# Patient Record
Sex: Female | Born: 1955 | Race: White | Hispanic: No | Marital: Married | Smoking: Former smoker
Health system: Southern US, Community
[De-identification: ages and names within clinical notes are randomized; demographics above are authoritative.]

## PROBLEM LIST (undated history)

## (undated) HISTORY — PX: PRE-MALIGNANT / BENIGN SKIN LESION EXCISION: SHX160

---

## 1997-11-23 ENCOUNTER — Other Ambulatory Visit: Admission: RE | Admit: 1997-11-23 | Discharge: 1997-11-23 | Payer: Self-pay | Admitting: Family Medicine

## 1998-03-04 HISTORY — PX: ABDOMINAL HYSTERECTOMY: SHX81

## 1998-04-20 ENCOUNTER — Ambulatory Visit (HOSPITAL_COMMUNITY): Admission: RE | Admit: 1998-04-20 | Discharge: 1998-04-20 | Payer: Self-pay | Admitting: Gastroenterology

## 1999-11-21 ENCOUNTER — Ambulatory Visit (HOSPITAL_COMMUNITY): Admission: RE | Admit: 1999-11-21 | Discharge: 1999-11-21 | Payer: Self-pay | Admitting: *Deleted

## 2000-08-11 ENCOUNTER — Inpatient Hospital Stay (HOSPITAL_COMMUNITY): Admission: RE | Admit: 2000-08-11 | Discharge: 2000-08-13 | Payer: Self-pay | Admitting: *Deleted

## 2000-08-11 ENCOUNTER — Encounter (INDEPENDENT_AMBULATORY_CARE_PROVIDER_SITE_OTHER): Payer: Self-pay | Admitting: Specialist

## 2001-05-14 ENCOUNTER — Ambulatory Visit (HOSPITAL_COMMUNITY): Admission: RE | Admit: 2001-05-14 | Discharge: 2001-05-14 | Payer: Self-pay | Admitting: Gastroenterology

## 2002-12-22 ENCOUNTER — Emergency Department (HOSPITAL_COMMUNITY): Admission: EM | Admit: 2002-12-22 | Discharge: 2002-12-23 | Payer: Self-pay | Admitting: Emergency Medicine

## 2004-06-26 ENCOUNTER — Emergency Department (HOSPITAL_COMMUNITY): Admission: EM | Admit: 2004-06-26 | Discharge: 2004-06-26 | Payer: Self-pay | Admitting: Family Medicine

## 2004-10-19 ENCOUNTER — Encounter: Admission: RE | Admit: 2004-10-19 | Discharge: 2004-10-19 | Payer: Self-pay | Admitting: Gastroenterology

## 2005-11-15 ENCOUNTER — Emergency Department (HOSPITAL_COMMUNITY): Admission: EM | Admit: 2005-11-15 | Discharge: 2005-11-15 | Payer: Self-pay | Admitting: Family Medicine

## 2006-05-19 ENCOUNTER — Encounter (INDEPENDENT_AMBULATORY_CARE_PROVIDER_SITE_OTHER): Payer: Self-pay | Admitting: Specialist

## 2006-05-19 ENCOUNTER — Ambulatory Visit (HOSPITAL_COMMUNITY): Admission: RE | Admit: 2006-05-19 | Discharge: 2006-05-19 | Payer: Self-pay | Admitting: Gastroenterology

## 2009-10-31 ENCOUNTER — Emergency Department (HOSPITAL_COMMUNITY): Admission: EM | Admit: 2009-10-31 | Discharge: 2009-10-31 | Payer: Self-pay | Admitting: Family Medicine

## 2010-05-18 LAB — POCT I-STAT, CHEM 8
BUN: 5 mg/dL — ABNORMAL LOW (ref 6–23)
Calcium, Ion: 1.16 mmol/L (ref 1.12–1.32)
Chloride: 105 mEq/L (ref 96–112)
Creatinine, Ser: 1 mg/dL (ref 0.4–1.2)
Glucose, Bld: 81 mg/dL (ref 70–99)
HCT: 41 % (ref 36.0–46.0)
Hemoglobin: 13.9 g/dL (ref 12.0–15.0)
Potassium: 3.5 mEq/L (ref 3.5–5.1)
Sodium: 141 mEq/L (ref 135–145)
TCO2: 26 mmol/L (ref 0–100)

## 2010-07-20 NOTE — Op Note (Signed)
[facility]  Patient:    Shelley Berry, Shelley Berry                           MRN: [id number] Proc. Date: 08/11/00 Attending:  Pershing Cox, M.D. CC:         Shelley Berry, M.D.   Operative Report  PREOPERATIVE DIAGNOSES:  Myomatous uterus, urinary frequency.  POSTOPERATIVE DIAGNOSES:  Myomatous uterus with inadvertent cystotomy.  PROCEDURE:  Exploratory laparotomy, total abdominal hysterectomy.  ANESTHESIA:  General endotracheal.  SURGEON:  Pershing Cox, M.D.  ASSISTANT:  Dr. Earlene Plater.  INDICATIONS FOR PROCEDURE:  Shelley Berry is a 55 year old female, who presented to her physician with urinary frequency.  She had one heavy [place] of bleeding on her menstrual periods.  She has a history of colitis and is on Asacol.  The patient had a sonogram in February 2001.  This showed multiple uterine myomas. The endometrial stripe was posteriorly displaced by a large anterior and fundal myoma but was otherwise normal.  After initial consultation in our office in March, she was seen by Dr. Deanne Coffer in consultation, as she wanted to consider uterine artery embolization.  She had a pelvic MRI which again showed multiple uterine myomas and the one myoma causing displacement of the uterine stripe.  She rethought her decision and decided to proceed with hysterectomy. Because of the size of her uterus, we did not consider LAVH.  OPERATIVE FINDINGS:  The uterus was approximately 14 weeks in size.  There were multiple fibroids, one lying in a pedunculated fashion just below the right ovary.  The right ovary itself contained a 2 cm cyst but was otherwise normal.  There were other uterine myomas distorting the fundus of the uterus and in particular, the anterior portion of the uterus but no cervical distortion or distortion of the uterine arteries.  Cul-de-sac was deep. Otherwise her examination was normal.  DESCRIPTION OF PROCEDURE:  Shelley Berry was brought to the operating  room with an IV in place.  She had received 1 gram of Ancef in the holding area.  She was placed supine on the OR table, and general endotracheal anesthesia was administered without difficulty.  She was placed in the frogleg position, and the anterior abdominal wall, upper thighs, perineum, and vagina were prepped with a solution of Hibiclens.  Foley catheter was sterilely inserted.  Exam under anesthesia before prepping had shown the uterus lifted.  Although there was a nodule in the back of the uterus, I felt that a transverse incision would be appropriate.  After the patient was prepped, she was draped for a transverse incision. Pfannenstiel incision was made, cutting through the skin to a length of approximately 12 cm.  This was carried down through the subcutaneous tissues until the fascia was encountered.  The fascia was opened and then incised with Mayo scissors, using a smile-like technique.  The fascia was separated from the rectus muscles by blunt dissection using cautery where appropriate. Rectus muscles were divided in the midline.  Peritoneum was tented and opened atraumatically.  The peritoneal incision was extended superiorly, and the upper dissection of the fascia from the muscles was also extended. Exploratory laparotomy was performed with no abnormal findings.  The self-retaining retractor was placed with towels protecting the skin. Bladder blade was used to retract the bladder.  The round ligaments were identified and suture ligated.  The peritoneum was incised by cautery, opening the broad ligament.  The posterior wall of  the broad ligament was incised, and the uteroovarian ligament was clamped with Masterson clamps.  This pedicle was cut, suture and free-tie ligated.  Next, the bladder was taken down off of the lower uterine segment and dissected from the lower uterine segment.  In dissecting the bladder, a small injury was made in the bladder approximately 0.5 cm  in size.  This defect was grasped with hemostats and held as we continued the dissection of the bladder off of the upper portion of the cervix.  Next, the cystotomy was closed using 3-0 Vicryl as a running stitch, then an inverting stitch.  The bladder was then placed away from the incision as we proceeded.  Uterine arteries were skeletonized, clamped, cut, and suture ligated.  A double-tie technique was made so that the second clamp and tie passed around the first, giving essentially a double uterine artery ligature.  Straight clamps were placed along the lower uterine segment, and a cardinal bite was taken on each side.  This was Heaney-fixated during ligation.  The uterosacral ligaments were grasped and rotated such that the clamp included the lower portion of the cardinal ligament.  These were cut and suture ligated and held for later in the procedure.  We entered the patients vagina on the right. Satinsky scissors were used to excise the cervix from the vagina.  The angles were then closed with a 0 Vicryl suture using an in and out fixated to the cuff technique.  Anterior and posterior vagina were whipped with a locking 0 Vicryl.  The anterior and posterior vagina were then brought together with a single suture.  Once we had completed this, the uterosacral ligaments were tied to the angled sutures.  We inspected beneath the bladder, and there was no evidence of bleeding.  Ovarian pedicle was sutured to the round ligament.  On the left, it was necessary to close the peritoneum between the two.  On the right, they were closely approximated, so a separate closure was not necessary.  The pelvis was irrigated with warm saline.  There was no evidence of bleeding. Rectosigmoid was layered down into the cul-de-sac.  Small bowel was allowed to fall out over the top.  The fascia was then closed using running 0 Monocryl from each side to the middle.  Marcaine 0.25% was injected into the  lateral margins of the fascia.  Underlying muscles were carefully inspected, and there  was no bleeding prior to closure.  Monocryl 0 was run from side-to-side and tied in the middle.  Reinforcing 0 Vicryl stitch was used when we were not able to get the bleeding from the edge to stop.  Marcaine 0.25% was injected into the skin, and skin staples were applied after inspecting the subcutaneous tissues and finding no evidence of bleeding.  SPECIMEN:  Myomatous uterus.  ESTIMATED BLOOD LOSS:  150 cc.  URINE OUTPUT:  175 cc. DD:  08/11/00 TD:  08/11/00 Job: [id number]

## 2010-07-20 NOTE — Procedures (Signed)
[place]  Patient:    Shelley Berry, Shelley A. Visit Number: [id number] MRN: [id number]          Service Type: Attending:  Anselmo Rod, M.D. Dictated by:   Anselmo Rod, M.D. Proc. Date: 05/14/01   CC:         Heather Roberts, M.D.   Procedure Report  DATE OF BIRTH:  [birth year].  REFERRING PHYSICIAN:  Heather Roberts, M.D.  PROCEDURE PERFORMED:  Colonoscopy.  ENDOSCOPIST:  Anselmo Rod, M.D.  INSTRUMENT USED:  Olympus pediatric colonoscope.  INDICATIONS FOR PROCEDURE:  Family history of colon cancer and a personal history of colitis in a 55 year old white female who has had recent flare. Rule out colonic polyps, masses, etc. and evaluate extent of disease.  PREPROCEDURE PREPARATION:  Informed consent was procured from the patient. The patient was fasted for eight hours prior to the procedure and prepped with a bottle of magnesium citrate and a gallon of NuLytely the night prior to the procedure.  PREPROCEDURE PHYSICAL:  The patient had stable vital signs.  Neck supple. Chest clear to auscultation.  S1, S2 regular.  Abdomen soft with normal abdominal bowel sounds.  DESCRIPTION OF PROCEDURE:  The patient was placed in the left lateral decubitus position and sedated with 60 mg of Demerol and 10 mg of Versed intravenously.  Once the patient was adequately sedated and maintained on low-flow oxygen and continuous cardiac monitoring, the Olympus video colonoscope was advanced from the rectum to the cecum and terminal ileum without difficulty.  The entire colonic mucosa up to the terminal ileum appeared healthy and without lesions.  The patient tolerated the procedure well without complications.  IMPRESSION:  Healthy-appearing colon.  RECOMMENDATIONS: 1. Repeat colorectal cancer screening is recommended in the next five years    unless the patient were to develop any abnormal symptoms in the interim. 2. Outpatient follow-up is  advised in the next two weeks. Dictated by:   Anselmo Rod, M.D. Attending:  Anselmo Rod, M.D. DD:  05/14/01 TD:  05/14/01 Job: [id number]

## 2010-07-20 NOTE — Discharge Summary (Signed)
[facility]  Patient:    Shelley Berry, Shelley Berry [facility]                       MRN: [id number] Adm. Date:  [id number] Disc. Date: 08/13/00 Attending:  Marin Comment CC:         Talmadge Coventry, M.D.                           Discharge Summary  ADMITTING DIAGNOSIS:  Symptomatic uterine myomas.  DISCHARGE DIAGNOSES: 1. Symptomatic uterine myomas. 2. Cystotomy.  PROCEDURE:  Total abdominal hysterectomy.  HOSPITAL COURSE:  For details of the patients admission History and Physical, please see the dictated History and Physical examination which is transcribed and in the chart dated August 11, 2000.  On the morning of admission, the patient was taken to the operating room.  Under general anesthesia, she underwent total abdominal hysterectomy.  She had 150 cc blood loss.  A small cystotomy was created in the dissection of the uterus from the bladder.  This was approximately 1/2 cm in size.  This was closed with an embrocating Vicryl stitch.  Foley catheter was placed and will be left in at the time of discharge.  The patients postoperative course was one of steady and progressive improvement.  On the evening of surgery, she used PCA morphine.  She had a lot of nightmares with this medication, but otherwise no other problems.  She received one dose of Toradol on the morning of postoperative Griffey #1, and then was able to start p.o. pain medications.  She required Tylox for pain relief. She began passing gas on the morning of postoperative Stolz #2.  She has tolerated a regular diet.  Her staples were removed and Steri-Strips with benzoin were applied.  She is to be discharged with her Foley catheter in place.  DISCHARGE MEDICATIONS: 1. Tylox one p.o. q.4h. 2. Darvocet one p.o. q.4h. 3. Macrobid 100 mg to take over the next five days until her Foley is discontinued. 4. Pyridium p.r.n. bladder spasm.  LABORATORY DATA AND X-RAY FINDINGS:  Final pathology returned  consistent with submucosal intramural and subserosal uterine myomas.  The patients hemoglobin on postoperative Laitinen #2 was 34.5. DD:  08/13/00 TD:  08/13/00 Job: [id number]

## 2010-07-20 NOTE — Op Note (Signed)
NAMEJONQUIL, Shelley Berry                  ACCOUNT NO.:  [id number]   MEDICAL RECORD NO.:  [id number]          PATIENT TYPE:  AMB   LOCATION:  ENDO                         FACILITY:  [facility]   PHYSICIAN:  Anselmo Rod, M.D.  DATE OF BIRTH:  Jun 22, 1955   DATE OF PROCEDURE:  05/19/2006  DATE OF DISCHARGE:                               OPERATIVE REPORT   PROCEDURE PERFORMED:  Colonoscopy with multiple cold biopsies.   ENDOSCOPIST:  Anselmo Rod, M.D.   INSTRUMENT USED:  Pentax video colonoscope.   INDICATIONS FOR PROCEDURE:  55 year old white female with a 10-year  history of ulcerative colitis undergoing surveillance colonoscopy to  rule out colonic polyps, masses, etc.   PREPROCEDURE PREPARATION:  Informed consent was procured from the  patient.  The patient fasted for 4 hours prior to the procedure and  prepped with 20 Osmoprep pills the night of and 12 Osmoprep pills the  morning of the procedure.  The risks and benefits of the procedure  including a 10% miss rate of cancer and polyp were discussed with the  patient as well.   PREPROCEDURE PHYSICAL:  The patient had stable vital signs.  Neck  supple.  Chest clear to auscultation.  S1, S2 regular.  Abdomen soft  with normal bowel sounds.   DESCRIPTION OF PROCEDURE:  The patient was placed in left lateral  decubitus position and sedated with 100 mcg of fentanyl and 8 mg of  Versed given intravenously in slow incremental doses. Once the patient  was adequately sedated and maintained on low-flow oxygen and continuous  cardiac monitoring, the Pentax video colonoscope was advanced from the  rectum to the cecum. The appendiceal orifice and ileocecal valve were  clearly visualized and photographed.  The terminal ileum appeared  healthy and without lesions.  There was patchy area with loss of  vascular markings.  Multiple biopsies were done from the right colon,  left colon and transverse colon to rule out dysplasia. No masses or  polyps  seen. There was no evidence of colitis. The patient tolerated the  procedure well without complications.  Retroflexion in rectum revealed  no abnormalities.   IMPRESSION:  1. Essentially normal colonoscopy from the rectum to the terminal      ileum.  No masses, polyps, erosions, ulcerations or diverticula      seen.  2. Patchy loss of vascular markings, biopsies done to rule out      dysplasia.   RECOMMENDATIONS:  1. Await pathology results.  2. Continue Asacol as before.  3. The patient is moving to the [place].  She is to      follow-up with a new gastroenterologist there.  All records will be      forwarded as requested by the patient.      Anselmo Rod, M.D.  Electronically Signed     JNM/MEDQ  D:  05/20/2006  T:  05/21/2006  Job:  [id number]

## 2010-08-03 HISTORY — PX: COLONOSCOPY: SHX174

## 2010-11-16 ENCOUNTER — Inpatient Hospital Stay (INDEPENDENT_AMBULATORY_CARE_PROVIDER_SITE_OTHER)
Admission: RE | Admit: 2010-11-16 | Discharge: 2010-11-16 | Disposition: A | Discharge status: Home / Self Care | Payer: 59 | Source: Ambulatory Visit | Attending: Family Medicine | Admitting: Family Medicine

## 2010-11-16 DIAGNOSIS — J40 Bronchitis, not specified as acute or chronic: Secondary | ICD-10-CM

## 2010-11-16 LAB — POCT RAPID STREP A: Streptococcus, Group A Screen (Direct): NEGATIVE

## 2010-12-24 ENCOUNTER — Ambulatory Visit (INDEPENDENT_AMBULATORY_CARE_PROVIDER_SITE_OTHER): Payer: Managed Care, Other (non HMO) | Admitting: Family Medicine

## 2010-12-24 ENCOUNTER — Encounter: Payer: Self-pay | Admitting: Family Medicine

## 2010-12-24 VITALS — BP 122/86 | HR 72 | Temp 98.1°F | Ht 64.0 in | Wt 195.0 lb

## 2010-12-24 DIAGNOSIS — R070 Pain in throat: Secondary | ICD-10-CM

## 2010-12-24 DIAGNOSIS — A6000 Herpesviral infection of urogenital system, unspecified: Secondary | ICD-10-CM

## 2010-12-24 DIAGNOSIS — K5190 Ulcerative colitis, unspecified, without complications: Secondary | ICD-10-CM | POA: Insufficient documentation

## 2010-12-24 DIAGNOSIS — Z23 Encounter for immunization: Secondary | ICD-10-CM

## 2010-12-24 MED ORDER — VALACYCLOVIR HCL 500 MG PO TABS: 500.0000 mg | ORAL_TABLET | Freq: Two times a day (BID) | ORAL | Status: DC

## 2010-12-24 NOTE — Progress Notes (Signed)
  Subjective:    Patient ID: Shelley Berry, female    DOB: 04/14/1955, 55 y.o.   MRN: [id number]  HPI 55 yr old female to establish with [place] and to discuss a recent throat problem. About 2 weeks ago she got choked on some food and had a sudden sharp pain in the back of the throat. After a few minutes the food went down but she had some sorness in the throat for about one week. Now she feels fine, no trouble swallowing or speaking. She had moved from [place] to [place] about 8 years ago, and then she moved back here earlier this year. She feels fine in general. Her ulcerative colitis is in complete remission, and she has no problems. She had a normal colonoscopy this summer, and she will be due for another one in 2 years.    Review of Systems  Constitutional: Negative.   Respiratory: Negative.   Cardiovascular: Negative.   Gastrointestinal: Negative.        Objective:   Physical Exam  Constitutional: She appears well-nourished.  HENT:  Right Ear: External ear normal.  Left Ear: External ear normal.  Nose: Nose normal.  Mouth/Throat: Oropharynx is clear and moist. No oropharyngeal exudate.  Eyes: Conjunctivae are normal. Pupils are equal, round, and reactive to light.  Neck: Normal range of motion. Neck supple. No JVD present. No tracheal deviation present. No thyromegaly present.  Cardiovascular: Regular rhythm, normal heart sounds and intact distal pulses.  Exam reveals no gallop and no friction rub.   No murmur heard. Pulmonary/Chest: Effort normal and breath sounds normal. No stridor.  Abdominal: Soft. Bowel sounds are normal. She exhibits no distension and no mass. There is no tenderness. There is no rebound and no guarding.  Lymphadenopathy:    She has no cervical adenopathy.          Assessment & Plan:  She seems to have strained a pharyngeal muscle when she got choked, and this has resolved. She will follow up if this occurs again. Her ulcerative colitis is in  remission. She will set up with [place] soon for a cpx and fasting labs.

## 2010-12-24 NOTE — Progress Notes (Signed)
Addended by: Aniceto Boss A on: 12/24/2010 05:33 PM   Modules accepted: Orders

## 2011-03-11 ENCOUNTER — Other Ambulatory Visit: Payer: Managed Care, Other (non HMO)

## 2011-03-12 ENCOUNTER — Other Ambulatory Visit (INDEPENDENT_AMBULATORY_CARE_PROVIDER_SITE_OTHER): Payer: Managed Care, Other (non HMO)

## 2011-03-12 DIAGNOSIS — Z0000 Encounter for general adult medical examination without abnormal findings: Secondary | ICD-10-CM

## 2011-03-12 LAB — HEPATIC FUNCTION PANEL
AST: 18 U/L (ref 0–37)
Albumin: 3.9 g/dL (ref 3.5–5.2)

## 2011-03-12 LAB — CBC WITH DIFFERENTIAL/PLATELET
Basophils Absolute: 0 10*3/uL (ref 0.0–0.1)
Eosinophils Absolute: 0.3 10*3/uL (ref 0.0–0.7)
HCT: 42.5 % (ref 36.0–46.0)
Hemoglobin: 14.1 g/dL (ref 12.0–15.0)
Lymphs Abs: 1.4 10*3/uL (ref 0.7–4.0)
MCHC: 33.2 g/dL (ref 30.0–36.0)
MCV: 95.8 fl (ref 78.0–100.0)
Neutro Abs: 5 10*3/uL (ref 1.4–7.7)
RDW: 14.4 % (ref 11.5–14.6)

## 2011-03-12 LAB — LDL CHOLESTEROL, DIRECT: Direct LDL: 140.9 mg/dL

## 2011-03-12 LAB — LIPID PANEL
Cholesterol: 222 mg/dL — ABNORMAL HIGH (ref 0–200)
VLDL: 18.6 mg/dL (ref 0.0–40.0)

## 2011-03-12 LAB — POCT URINALYSIS DIPSTICK
Blood, UA: NEGATIVE
Glucose, UA: NEGATIVE
Spec Grav, UA: 1.025
Urobilinogen, UA: 0.2

## 2011-03-12 LAB — BASIC METABOLIC PANEL
CO2: 29 mEq/L (ref 19–32)
Glucose, Bld: 95 mg/dL (ref 70–99)
Potassium: 4.9 mEq/L (ref 3.5–5.1)
Sodium: 142 mEq/L (ref 135–145)

## 2011-03-15 NOTE — Progress Notes (Signed)
Quick Note:  Left voice message ______ 

## 2011-03-18 ENCOUNTER — Encounter: Payer: Managed Care, Other (non HMO) | Admitting: Family Medicine

## 2011-05-01 ENCOUNTER — Ambulatory Visit (INDEPENDENT_AMBULATORY_CARE_PROVIDER_SITE_OTHER): Payer: Managed Care, Other (non HMO) | Admitting: Family Medicine

## 2011-05-01 ENCOUNTER — Encounter: Payer: Self-pay | Admitting: Family Medicine

## 2011-05-01 VITALS — BP 112/72 | HR 79 | Temp 98.3°F | Ht 64.0 in | Wt 194.0 lb

## 2011-05-01 DIAGNOSIS — Z0000 Encounter for general adult medical examination without abnormal findings: Secondary | ICD-10-CM

## 2011-05-01 MED ORDER — VALACYCLOVIR HCL 500 MG PO TABS: 500.0000 mg | ORAL_TABLET | Freq: Two times a day (BID) | ORAL | Status: DC

## 2011-05-01 NOTE — Progress Notes (Signed)
  Subjective:    Patient ID: Shelley Berry, female    DOB: Sep 28, 1955, 56 y.o.   MRN: [id number]  HPI 56 yr old female for a cpx. She feels fine and has no concerns.    Review of Systems  Constitutional: Negative.   HENT: Negative.   Eyes: Negative.   Respiratory: Negative.   Cardiovascular: Negative.   Gastrointestinal: Negative.   Genitourinary: Negative for dysuria, urgency, frequency, hematuria, flank pain, decreased urine volume, enuresis, difficulty urinating, pelvic pain and dyspareunia.  Musculoskeletal: Negative.   Skin: Negative.   Neurological: Negative.   Hematological: Negative.   Psychiatric/Behavioral: Negative.        Objective:   Physical Exam  Constitutional: She is oriented to person, place, and time. She appears well-developed and well-nourished. No distress.  HENT:  Head: Normocephalic and atraumatic.  Right Ear: External ear normal.  Left Ear: External ear normal.  Nose: Nose normal.  Mouth/Throat: Oropharynx is clear and moist. No oropharyngeal exudate.  Eyes: Conjunctivae and EOM are normal. Pupils are equal, round, and reactive to light. No scleral icterus.  Neck: Normal range of motion. Neck supple. No JVD present. No thyromegaly present.  Cardiovascular: Normal rate, regular rhythm, normal heart sounds and intact distal pulses.  Exam reveals no gallop and no friction rub.   No murmur heard.      EKG normal   Pulmonary/Chest: Effort normal and breath sounds normal. No respiratory distress. She has no wheezes. She has no rales. She exhibits no tenderness.  Abdominal: Soft. Bowel sounds are normal. She exhibits no distension and no mass. There is no tenderness. There is no rebound and no guarding.  Genitourinary: No breast swelling, tenderness, discharge or bleeding.  Musculoskeletal: Normal range of motion. She exhibits no edema and no tenderness.  Lymphadenopathy:    She has no cervical adenopathy.  Neurological: She is alert and oriented to person,  place, and time. She has normal reflexes. No cranial nerve deficit. She exhibits normal muscle tone. Coordination normal.  Skin: Skin is warm and dry. No rash noted. No erythema.  Psychiatric: She has a normal mood and affect. Her behavior is normal. Judgment and thought content normal.          Assessment & Plan:  Well exam. We will refer her back to Dermatology for a skin check. She will set up a mammogram.

## 2011-05-23 ENCOUNTER — Encounter: Payer: Self-pay | Admitting: Family Medicine

## 2011-09-03 ENCOUNTER — Ambulatory Visit (INDEPENDENT_AMBULATORY_CARE_PROVIDER_SITE_OTHER): Payer: Managed Care, Other (non HMO) | Admitting: Internal Medicine

## 2011-09-03 ENCOUNTER — Encounter: Payer: Self-pay | Admitting: Internal Medicine

## 2011-09-03 ENCOUNTER — Ambulatory Visit: Payer: Managed Care, Other (non HMO) | Admitting: Internal Medicine

## 2011-09-03 VITALS — BP 112/74 | Temp 98.3°F | Wt 193.0 lb

## 2011-09-03 DIAGNOSIS — N644 Mastodynia: Secondary | ICD-10-CM

## 2011-09-03 NOTE — Patient Instructions (Signed)
Call or return to clinic prn if these symptoms worsen or fail to improve as anticipated.  Call the mammogram center for further recommendations about possible additional imaging studies of the right breast

## 2011-09-03 NOTE — Progress Notes (Signed)
  Subjective:    Patient ID: Shelley Berry, female    DOB: 1956/02/21, 56 y.o.   MRN: [id number]  HPI 56 year old patient who presents with a chief complaint of left medial breast pain for 3 days duration. She has had a recent mammogram that was reviewed. Pain is aggravated by movement especially side-to-side motion. No palpable breast lump noted by the patient. Recent mammogram was reviewed it did suggest additional studies of the right breast were recommended. However the patient receive notification of the mammogram was normal and no further films were recommended.   Review of Systems  Constitutional: Negative.        Objective:   Physical Exam  Constitutional: She appears well-developed and well-nourished. No distress.  Pulmonary/Chest:       The left breast was unremarkable without mass. There was some slight tenderness  involving the inferior and medial aspect of the breast at the chest wall insertion site.          Assessment & Plan:   Breast pain. This appears to be more muscle ligamentous. We'll clinically observe. She will followup with radiology about further recommendations about imaging studies of the right breast

## 2012-02-14 ENCOUNTER — Encounter: Payer: Self-pay | Admitting: Family Medicine

## 2012-02-14 ENCOUNTER — Ambulatory Visit (INDEPENDENT_AMBULATORY_CARE_PROVIDER_SITE_OTHER): Payer: Managed Care, Other (non HMO) | Admitting: Family Medicine

## 2012-02-14 VITALS — BP 122/86 | HR 71 | Temp 98.4°F | Wt 195.0 lb

## 2012-02-14 DIAGNOSIS — M76899 Other specified enthesopathies of unspecified lower limb, excluding foot: Secondary | ICD-10-CM

## 2012-02-14 DIAGNOSIS — M7060 Trochanteric bursitis, unspecified hip: Secondary | ICD-10-CM

## 2012-02-14 MED ORDER — METHYLPREDNISOLONE 4 MG PO KIT: PACK | ORAL | Status: AC

## 2012-02-14 NOTE — Progress Notes (Signed)
  Subjective:    Patient ID: Shelley Berry, female    DOB: 1955/04/03, 56 y.o.   MRN: [id number]  HPI Here for several weeks of pain in the right lateral hip area. 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 No back or leg issues.    Review of Systems  Constitutional: Negative.        Objective:   Physical Exam  Constitutional: She appears well-developed and well-nourished.  Musculoskeletal:       Tender over the right greater trochanter. Full ROM of both hips           Assessment & Plan:  Rest, ice, Medrol dose pack.

## 2012-04-24 ENCOUNTER — Other Ambulatory Visit: Payer: Managed Care, Other (non HMO)

## 2012-05-01 ENCOUNTER — Encounter: Payer: Managed Care, Other (non HMO) | Admitting: Family Medicine

## 2012-05-10 ENCOUNTER — Emergency Department (HOSPITAL_COMMUNITY)
Admission: EM | Admit: 2012-05-10 | Discharge: 2012-05-10 | Disposition: A | Discharge status: Home / Self Care | Payer: Managed Care, Other (non HMO) | Attending: Emergency Medicine | Admitting: Emergency Medicine

## 2012-05-10 ENCOUNTER — Emergency Department (HOSPITAL_COMMUNITY): Payer: Managed Care, Other (non HMO)

## 2012-05-10 ENCOUNTER — Encounter (HOSPITAL_COMMUNITY): Payer: Self-pay | Admitting: Emergency Medicine

## 2012-05-10 DIAGNOSIS — Z87891 Personal history of nicotine dependence: Secondary | ICD-10-CM | POA: Insufficient documentation

## 2012-05-10 DIAGNOSIS — Y929 Unspecified place or not applicable: Secondary | ICD-10-CM | POA: Insufficient documentation

## 2012-05-10 DIAGNOSIS — Y939 Activity, unspecified: Secondary | ICD-10-CM | POA: Insufficient documentation

## 2012-05-10 DIAGNOSIS — W010XXA Fall on same level from slipping, tripping and stumbling without subsequent striking against object, initial encounter: Secondary | ICD-10-CM | POA: Insufficient documentation

## 2012-05-10 DIAGNOSIS — Z8619 Personal history of other infectious and parasitic diseases: Secondary | ICD-10-CM | POA: Insufficient documentation

## 2012-05-10 DIAGNOSIS — IMO0002 Reserved for concepts with insufficient information to code with codable children: Secondary | ICD-10-CM | POA: Insufficient documentation

## 2012-05-10 DIAGNOSIS — Z79899 Other long term (current) drug therapy: Secondary | ICD-10-CM | POA: Insufficient documentation

## 2012-05-10 LAB — POCT I-STAT, CHEM 8
Chloride: 106 mEq/L (ref 96–112)
Glucose, Bld: 89 mg/dL (ref 70–99)
HCT: 43 % (ref 36.0–46.0)
Hemoglobin: 14.6 g/dL (ref 12.0–15.0)
Potassium: 4.1 mEq/L (ref 3.5–5.1)
Sodium: 141 mEq/L (ref 135–145)

## 2012-05-10 LAB — ETHANOL: Alcohol, Ethyl (B): 65 mg/dL — ABNORMAL HIGH (ref 0–11)

## 2012-05-10 MED ORDER — HYDROMORPHONE HCL PF 1 MG/ML IJ SOLN
1.0000 mg | Freq: Once | INTRAMUSCULAR | Status: AC
  Administered 2012-05-10: 1 mg via INTRAVENOUS
  Filled 2012-05-10: qty 1

## 2012-05-10 MED ORDER — METHOCARBAMOL 750 MG PO TABS: 750.0000 mg | ORAL_TABLET | Freq: Four times a day (QID) | ORAL | Status: DC

## 2012-05-10 MED ORDER — ONDANSETRON 8 MG PO TBDP: 8.0000 mg | ORAL_TABLET | Freq: Three times a day (TID) | ORAL | Status: DC | PRN

## 2012-05-10 MED ORDER — LORAZEPAM 2 MG/ML IJ SOLN
1.0000 mg | Freq: Once | INTRAMUSCULAR | Status: AC
Start: 2012-05-10 — End: 2012-05-10
  Administered 2012-05-10: 1 mg via INTRAVENOUS
  Filled 2012-05-10: qty 1

## 2012-05-10 MED ORDER — OXYCODONE-ACETAMINOPHEN 5-325 MG PO TABS: 2.0000 | ORAL_TABLET | ORAL | Status: DC | PRN

## 2012-05-10 MED ORDER — SODIUM CHLORIDE 0.9 % IV SOLN
INTRAVENOUS | Status: DC
  Administered 2012-05-10: 10 mL via INTRAVENOUS

## 2012-05-10 NOTE — ED Notes (Signed)
After removing off LSB no neuro deficits, denies numbness still can move upper & lower extremities. Noted redness to middle of back this area where her pain is on palpation.

## 2012-05-10 NOTE — ED Notes (Signed)
Admits to drinking this am, while walking down 10 flights of basement  stairs slipped at the top & rolled all the way down hitting head on the wall. Denies LOC, numbness to arm, jammed right thumb with abrasion to right hand,  just pain to upper back 10/10,

## 2012-05-10 NOTE — ED Notes (Signed)
Patient transported to CT 

## 2012-05-10 NOTE — ED Provider Notes (Signed)
History     CSN: [id number]  Arrival date & time 05/10/12  0821   First MD Initiated Contact with Patient 05/10/12 [phone]      Chief Complaint  Patient presents with  . Fall    (Consider location/radiation/quality/duration/timing/severity/associated sxs/prior treatment) Patient is a 57 y.o. female presenting with fall. The history is provided by the patient.  Fall   patient here after falling down 10 steps into her basement just prior to arrival. Patient denies loss of consciousness. Complains of severe midthoracic back pain. Denies any neck pain. Resume to using alcohol this morning. No numbness or tingling to arms or legs. Denies any chest or abdominal pain. Was able to ambulate and was driven here by her friend. Pain characterized as sharp and worse with movement and without dyspnea  Past Medical History  Diagnosis Date  . Ulcerative colitis onset 1998    sees Dr. Elsie Amis  . Genital herpes     Past Surgical History  Procedure Laterality Date  . Abdominal hysterectomy  2000    TAH, ovaries intact   . Colonoscopy  June 2012    per Dr. Loreta Ave, normal, repeat in 2 yrs   . Pre-malignant / benign skin lesion excision      several on the back and right thigh     Family History  Problem Relation Age of Onset  . Colon cancer Mother   . Colon polyps Brother   . Colon cancer Maternal Grandmother     History  Substance Use Topics  . Smoking status: Former Smoker    Types: Cigarettes  . Smokeless tobacco: Never Used  . Alcohol Use: 1.5 oz/week    3 drink(s) per week    OB History   Grav Para Term Preterm Abortions TAB SAB Ect Mult Living                  Review of Systems  All other systems reviewed and are negative.    Allergies  Nsaids  Home Medications   Current Outpatient Rx  Name  Route  Sig  Dispense  Refill  . mesalamine (ASACOL) 400 MG EC tablet   Oral   Take 1,200 mg by mouth daily.          . valACYclovir (VALTREX) 500 MG tablet   Oral   Take  500 mg by mouth daily.           BP 136/86  Pulse 79  Temp(Src) 98.1 F (36.7 C) (Oral)  Resp 18  SpO2 99%  Physical Exam  Nursing note and vitals reviewed. Constitutional: She is oriented to person, place, and time. She appears well-developed and well-nourished.  Non-toxic appearance. No distress.  HENT:  Head: Normocephalic and atraumatic.  Eyes: Conjunctivae, EOM and lids are normal. Pupils are equal, round, and reactive to light.  Neck: Normal range of motion. Neck supple. No tracheal deviation present. No mass present.  Cardiovascular: Normal rate, regular rhythm and normal heart sounds.  Exam reveals no gallop.   No murmur heard. Pulmonary/Chest: Effort normal and breath sounds normal. No stridor. No respiratory distress. She has no decreased breath sounds. She has no wheezes. She has no rhonchi. She has no rales.    Abdominal: Soft. Normal appearance and bowel sounds are normal. She exhibits no distension. There is no tenderness. There is no rebound and no CVA tenderness.  Musculoskeletal: Normal range of motion. She exhibits no edema and no tenderness.       Hands: Neurological:  She is alert and oriented to person, place, and time. She has normal strength. No cranial nerve deficit or sensory deficit. GCS eye subscore is 4. GCS verbal subscore is 5. GCS motor subscore is 6.  Skin: Skin is warm and dry. No abrasion and no rash noted.  Psychiatric: She has a normal mood and affect. Her speech is normal and behavior is normal.    ED Course  Procedures (including critical care time)  Labs Reviewed - No data to display No results found.   No diagnosis found.    MDM  Pt given pain meds and feels better--neuro exam is stable--stable for discharge        Toy Baker, MD 05/10/12 1020

## 2012-10-08 ENCOUNTER — Other Ambulatory Visit: Payer: Self-pay | Admitting: Gastroenterology

## 2012-10-08 DIAGNOSIS — R1012 Left upper quadrant pain: Secondary | ICD-10-CM

## 2012-10-23 ENCOUNTER — Ambulatory Visit (HOSPITAL_COMMUNITY)
Admission: RE | Admit: 2012-10-23 | Discharge: 2012-10-23 | Disposition: A | Discharge status: Home / Self Care | Payer: Managed Care, Other (non HMO) | Source: Ambulatory Visit | Attending: Gastroenterology | Admitting: Gastroenterology

## 2012-10-23 ENCOUNTER — Encounter (HOSPITAL_COMMUNITY)
Admission: RE | Admit: 2012-10-23 | Discharge: 2012-10-23 | Disposition: A | Discharge status: Home / Self Care | Payer: Managed Care, Other (non HMO) | Source: Ambulatory Visit | Attending: Gastroenterology | Admitting: Gastroenterology

## 2012-10-23 DIAGNOSIS — R109 Unspecified abdominal pain: Secondary | ICD-10-CM | POA: Insufficient documentation

## 2012-10-23 DIAGNOSIS — R1012 Left upper quadrant pain: Secondary | ICD-10-CM

## 2012-10-23 DIAGNOSIS — K7689 Other specified diseases of liver: Secondary | ICD-10-CM | POA: Insufficient documentation

## 2012-10-23 MED ORDER — TECHNETIUM TC 99M MEBROFENIN IV KIT
5.5000 | PACK | Freq: Once | INTRAVENOUS | Status: AC | PRN
  Administered 2012-10-23: 6 via INTRAVENOUS

## 2012-10-23 MED ORDER — SINCALIDE 5 MCG IJ SOLR
0.0200 ug/kg | Freq: Once | INTRAMUSCULAR | Status: AC
  Administered 2012-10-23: 11:00:00 via INTRAVENOUS

## 2012-10-26 LAB — GLUCOSE, CAPILLARY: Glucose-Capillary: 93 mg/dL (ref 70–99)

## 2012-11-04 ENCOUNTER — Encounter: Payer: Self-pay | Admitting: Gastroenterology

## 2013-11-21 ENCOUNTER — Emergency Department (INDEPENDENT_AMBULATORY_CARE_PROVIDER_SITE_OTHER)
Admission: EM | Admit: 2013-11-21 | Discharge: 2013-11-21 | Disposition: A | Discharge status: Home / Self Care | Payer: Managed Care, Other (non HMO) | Source: Home / Self Care | Attending: Family Medicine | Admitting: Family Medicine

## 2013-11-21 ENCOUNTER — Encounter (HOSPITAL_COMMUNITY): Payer: Self-pay | Admitting: Emergency Medicine

## 2013-11-21 DIAGNOSIS — H109 Unspecified conjunctivitis: Secondary | ICD-10-CM

## 2013-11-21 MED ORDER — KETOROLAC TROMETHAMINE 0.5 % OP SOLN: 1.0000 [drp] | Freq: Four times a day (QID) | OPHTHALMIC | Status: DC

## 2013-11-21 MED ORDER — POLYMYXIN B-TRIMETHOPRIM 10000-0.1 UNIT/ML-% OP SOLN: 1.0000 [drp] | OPHTHALMIC | Status: DC

## 2013-11-21 MED ORDER — TETRACAINE HCL 0.5 % OP SOLN
OPHTHALMIC | Status: AC
  Filled 2013-11-21: qty 2

## 2013-11-21 MED ORDER — TETRACAINE HCL 0.5 % OP SOLN
2.0000 [drp] | Freq: Once | OPHTHALMIC | Status: AC
  Administered 2013-11-21: 2 [drp] via OPHTHALMIC

## 2013-11-21 NOTE — Discharge Instructions (Signed)

## 2013-11-21 NOTE — ED Provider Notes (Signed)
CSN: [id number]     Arrival date & time 11/21/13  1808 History   First MD Initiated Contact with Patient 11/21/13 1821     Chief Complaint  Patient presents with  . Foreign Body in Eye   (Consider location/radiation/quality/duration/timing/severity/associated sxs/prior Treatment) HPI Comments: 58 year old female presents complaining of right eye pain. Last night an insect flew into her eye and got stuck between her eyeball and her lower eyelid. She feels like this may have scratched her eye. She rinsed her eye out but is not sure she got all of the insect out of her eye. The eye is red and there has been some thick drainage. No blurry vision or photophobia. No other systemic symptoms.  Patient is a 58 y.o. female presenting with foreign body in eye.  Foreign Body in Eye    Past Medical History  Diagnosis Date  . Ulcerative colitis onset 1998    sees Dr. Elsie Amis  . Genital herpes    Past Surgical History  Procedure Laterality Date  . Abdominal hysterectomy  2000    TAH, ovaries intact   . Colonoscopy  June 2012    per Dr. Loreta Ave, normal, repeat in 2 yrs   . Pre-malignant / benign skin lesion excision      several on the back and right thigh    Family History  Problem Relation Age of Onset  . Colon cancer Mother   . Colon polyps Brother   . Colon cancer Maternal Grandmother    History  Substance Use Topics  . Smoking status: Former Smoker    Types: Cigarettes  . Smokeless tobacco: Never Used  . Alcohol Use: 1.5 oz/week    3 drink(s) per week   OB History   Grav Para Term Preterm Abortions TAB SAB Ect Mult Living                 Review of Systems  Eyes: Positive for pain, discharge and redness. Negative for visual disturbance.  All other systems reviewed and are negative.   Allergies  Nsaids  Home Medications   Prior to Admission medications   Medication Sig Start Date End Date Taking? Authorizing Provider  ketorolac (ACULAR) 0.5 % ophthalmic solution Place 1  drop into the right eye every 6 (six) hours. 11/21/13   Adrian Blackwater Adalbert Alberto, PA-C  LORazepam (ATIVAN) 0.5 MG tablet Take 0.5 mg by mouth every 8 (eight) hours. Anxiety    Historical Provider, MD  mesalamine (ASACOL) 400 MG EC tablet Take 1,200 mg by mouth daily.     Historical Provider, MD  methocarbamol (ROBAXIN-750) 750 MG tablet Take 1 tablet (750 mg total) by mouth 4 (four) times daily. 05/10/12   Toy Abryanna Musolino, MD  Multiple Vitamins-Minerals (MULTIVITAMIN WITH MINERALS) tablet Take 1 tablet by mouth daily.    Historical Provider, MD  ondansetron (ZOFRAN ODT) 8 MG disintegrating tablet Take 1 tablet (8 mg total) by mouth every 8 (eight) hours as needed for nausea. 05/10/12   Toy Alliya Marcon, MD  oxyCODONE-acetaminophen (PERCOCET/ROXICET) 5-325 MG per tablet Take 2 tablets by mouth every 4 (four) hours as needed for pain. 05/10/12   Toy Kareli Hossain, MD  sertraline (ZOLOFT) 100 MG tablet Take 100 mg by mouth daily.    Historical Provider, MD  trimethoprim-polymyxin b (POLYTRIM) ophthalmic solution Place 1 drop into the right eye every 3 (three) hours. While awake, up to 6 doses per [place] 11/21/13   Graylon Good, PA-C  valACYclovir (VALTREX) 500 MG tablet  Take 500 mg by mouth daily. 05/01/11   Nelwyn Salisbury, MD  Vitamin D, Ergocalciferol, (DRISDOL) 50000 UNITS CAPS Take 50,000 Units by mouth every 7 (seven) days. monday    Historical Provider, MD  Vitamins A D C CHEW Chew 1 tablet by mouth daily.    Historical Provider, MD   BP 136/86  Pulse 85  Temp(Src) 98.5 F (36.9 C) (Oral)  Resp 18  SpO2 100% Physical Exam  Nursing note and vitals reviewed. Constitutional: She is oriented to person, place, and time. Vital signs are normal. She appears well-developed and well-nourished. No distress.  HENT:  Head: Normocephalic and atraumatic.  Eyes: EOM are normal. Right eye exhibits discharge. Right eye exhibits no chemosis. Right conjunctiva is injected. Left conjunctiva is not injected.  Slit lamp exam:       The right eye shows fluorescein uptake (lower midline conjunctival abrasion ). The right eye shows no corneal flare and no corneal ulcer.  Neck: Normal range of motion. Neck supple.  Cardiovascular: Normal rate, regular rhythm and normal heart sounds.   Pulmonary/Chest: Effort normal and breath sounds normal. No respiratory distress.  Lymphadenopathy:    She has no cervical adenopathy.  Neurological: She is alert and oriented to person, place, and time. She has normal strength. Coordination normal.  Skin: Skin is warm and dry. No rash noted. She is not diaphoretic.  Psychiatric: She has a normal mood and affect. Judgment normal.    ED Course  Procedures (including critical care time) Labs Review Labs Reviewed - No data to display  Imaging Review No results found.   MDM   1. Conjunctivitis of right eye    Treat with Polytrim eyedrops, ketorolac drops for comfort, followup if no improvement in a few days.   Meds ordered this encounter  Medications  . tetracaine (PONTOCAINE) 0.5 % ophthalmic solution 2 drop    Sig:   . DISCONTD: trimethoprim-polymyxin b (POLYTRIM) ophthalmic solution    Sig: Place 1 drop into the right eye every 3 (three) hours. While awake, up to 6 doses per [place]    Dispense:  10 mL    Refill:  0    Order Specific Question:  Supervising Provider    Answer:  Clementeen Graham, S [[id number]]  . DISCONTD: ketorolac (ACULAR) 0.5 % ophthalmic solution    Sig: Place 1 drop into the right eye every 6 (six) hours.    Dispense:  3 mL    Refill:  0    Order Specific Question:  Supervising Provider    Answer:  Clementeen Graham, S [id number]  . ketorolac (ACULAR) 0.5 % ophthalmic solution    Sig: Place 1 drop into the right eye every 6 (six) hours.    Dispense:  3 mL    Refill:  0    Order Specific Question:  Supervising Provider    Answer:  Clementeen Graham, S [id number]  . trimethoprim-polymyxin b (POLYTRIM) ophthalmic solution    Sig: Place 1 drop into the right eye every 3 (three) hours.  While awake, up to 6 doses per [place]    Dispense:  10 mL    Refill:  0    Order Specific Question:  Supervising Provider    Answer:  Clementeen Graham, Kathie Rhodes [[id number]]       Graylon Good, PA-C 11/21/13 2008

## 2013-11-21 NOTE — ED Notes (Signed)
?  insect ? Flew in to eye last PM, and unsure if she was able to get all of it out. Concern she may have a scratch in her eye

## 2013-11-23 NOTE — ED Provider Notes (Signed)
Medical screening examination/treatment/procedure(s) were performed by a resident physician or non-physician practitioner and as the supervising physician I was immediately available for consultation/collaboration.  Sumiye Hirth, MD    Sheryl Towell S Hatley Henegar, MD 11/23/13 0746 

## 2014-11-15 ENCOUNTER — Other Ambulatory Visit (HOSPITAL_COMMUNITY): Payer: Self-pay | Admitting: Internal Medicine

## 2014-11-15 DIAGNOSIS — R600 Localized edema: Secondary | ICD-10-CM

## 2014-11-21 ENCOUNTER — Other Ambulatory Visit: Payer: Self-pay

## 2014-11-21 ENCOUNTER — Ambulatory Visit (HOSPITAL_COMMUNITY): Payer: Managed Care, Other (non HMO)

## 2014-11-21 ENCOUNTER — Ambulatory Visit (HOSPITAL_COMMUNITY): Payer: Managed Care, Other (non HMO) | Attending: Cardiology

## 2014-11-21 DIAGNOSIS — R600 Localized edema: Secondary | ICD-10-CM | POA: Diagnosis not present

## 2014-11-21 DIAGNOSIS — I340 Nonrheumatic mitral (valve) insufficiency: Secondary | ICD-10-CM | POA: Diagnosis not present

## 2014-11-21 DIAGNOSIS — R609 Edema, unspecified: Secondary | ICD-10-CM | POA: Diagnosis present

## 2014-11-21 DIAGNOSIS — I071 Rheumatic tricuspid insufficiency: Secondary | ICD-10-CM | POA: Diagnosis not present

## 2014-11-21 DIAGNOSIS — Z87891 Personal history of nicotine dependence: Secondary | ICD-10-CM | POA: Diagnosis not present

## 2014-11-28 ENCOUNTER — Other Ambulatory Visit: Payer: Self-pay | Admitting: Internal Medicine

## 2014-11-28 DIAGNOSIS — R609 Edema, unspecified: Secondary | ICD-10-CM

## 2014-11-28 DIAGNOSIS — R1011 Right upper quadrant pain: Secondary | ICD-10-CM

## 2014-12-01 ENCOUNTER — Other Ambulatory Visit: Payer: Self-pay | Admitting: Internal Medicine

## 2014-12-01 DIAGNOSIS — R1011 Right upper quadrant pain: Secondary | ICD-10-CM

## 2014-12-02 ENCOUNTER — Other Ambulatory Visit: Payer: Managed Care, Other (non HMO)

## 2014-12-05 ENCOUNTER — Ambulatory Visit
Admission: RE | Admit: 2014-12-05 | Discharge: 2014-12-05 | Disposition: A | Discharge status: Home / Self Care | Payer: Managed Care, Other (non HMO) | Source: Ambulatory Visit | Attending: Internal Medicine | Admitting: Internal Medicine

## 2014-12-05 ENCOUNTER — Other Ambulatory Visit: Payer: Managed Care, Other (non HMO)

## 2014-12-05 DIAGNOSIS — R609 Edema, unspecified: Secondary | ICD-10-CM

## 2014-12-05 DIAGNOSIS — R1011 Right upper quadrant pain: Secondary | ICD-10-CM

## 2014-12-26 ENCOUNTER — Other Ambulatory Visit: Payer: Self-pay | Admitting: Nurse Practitioner

## 2014-12-26 DIAGNOSIS — I872 Venous insufficiency (chronic) (peripheral): Secondary | ICD-10-CM

## 2015-01-19 ENCOUNTER — Other Ambulatory Visit: Payer: Managed Care, Other (non HMO)

## 2015-01-24 ENCOUNTER — Other Ambulatory Visit: Payer: Managed Care, Other (non HMO)

## 2016-11-01 ENCOUNTER — Other Ambulatory Visit: Payer: Self-pay | Admitting: Sports Medicine

## 2016-11-01 DIAGNOSIS — M79672 Pain in left foot: Secondary | ICD-10-CM

## 2016-11-01 DIAGNOSIS — M67472 Ganglion, left ankle and foot: Secondary | ICD-10-CM

## 2016-11-07 ENCOUNTER — Ambulatory Visit
Admission: RE | Admit: 2016-11-07 | Discharge: 2016-11-07 | Disposition: A | Discharge status: Home / Self Care | Payer: Managed Care, Other (non HMO) | Source: Ambulatory Visit | Attending: Sports Medicine | Admitting: Sports Medicine

## 2016-11-07 DIAGNOSIS — M67472 Ganglion, left ankle and foot: Secondary | ICD-10-CM

## 2016-11-07 DIAGNOSIS — M79672 Pain in left foot: Secondary | ICD-10-CM

## 2016-11-07 MED ORDER — GADOBENATE DIMEGLUMINE 529 MG/ML IV SOLN
17.0000 mL | Freq: Once | INTRAVENOUS | Status: AC | PRN
  Administered 2016-11-07: 17 mL via INTRAVENOUS

## 2017-05-30 ENCOUNTER — Ambulatory Visit (HOSPITAL_COMMUNITY)
Admission: EM | Admit: 2017-05-30 | Discharge: 2017-05-30 | Disposition: A | Discharge status: Home / Self Care | Payer: 59 | Attending: Family Medicine | Admitting: Family Medicine

## 2017-05-30 ENCOUNTER — Encounter (HOSPITAL_COMMUNITY): Payer: Self-pay | Admitting: Emergency Medicine

## 2017-05-30 DIAGNOSIS — R1314 Dysphagia, pharyngoesophageal phase: Secondary | ICD-10-CM

## 2017-05-30 MED ORDER — DICYCLOMINE HCL 20 MG PO TABS: 20.0000 mg | ORAL_TABLET | Freq: Two times a day (BID) | ORAL | 0 refills | Status: DC

## 2017-05-30 MED ORDER — GLUCAGON HCL RDNA (DIAGNOSTIC) 1 MG IJ SOLR
1.0000 mg | Freq: Once | INTRAMUSCULAR | Status: AC
  Administered 2017-05-30: 1 mg via INTRAVENOUS

## 2017-05-30 MED ORDER — GLUCAGON HCL RDNA (DIAGNOSTIC) 1 MG IJ SOLR
1.0000 mg | Freq: Once | INTRAMUSCULAR | Status: AC
  Administered 2017-05-30: 1 mg via INTRAMUSCULAR

## 2017-05-30 MED ORDER — SODIUM CHLORIDE 0.9 % IV BOLUS
1000.0000 mL | Freq: Once | INTRAVENOUS | Status: AC
  Administered 2017-05-30: 1000 mL via INTRAVENOUS

## 2017-05-30 MED ORDER — GLUCAGON HCL RDNA (DIAGNOSTIC) 1 MG IJ SOLR
INTRAMUSCULAR | Status: AC
  Filled 2017-05-30: qty 1

## 2017-05-30 NOTE — ED Provider Notes (Signed)
[facility]    CSN: [id number] Arrival date & time: 05/30/17  1728     History   Chief Complaint No chief complaint on file.   HPI Shelley Berry is a 62 y.o. female. HPI patient has significant dysphagia.  Pills get stuck in her throatPills get stuck in her throat and she is to elevate and vomits them up and she is to elevate and vomits them up.  This also happens with boluses of meat. This also happens with boluses of meat  Past Medical History:  Diagnosis Date  . Genital herpes   . Ulcerative colitis onset 1998   sees Dr. Elsie AmisNat Mann    Patient Active Problem List   Diagnosis Date Noted  . Ulcerative colitis 12/24/2010    Past Surgical History:  Procedure Laterality Date  . ABDOMINAL HYSTERECTOMY  2000   TAH, ovaries intact   . COLONOSCOPY  June 2012   per Dr. Loreta AveMann, normal, repeat in 2 yrs   . PRE-MALIGNANT / BENIGN SKIN LESION EXCISION     several on the back and right thigh     OB History   None      Home Medications    Prior to Admission medications   Medication Sig Start Date End Date Taking? Authorizing Provider  ketorolac (ACULAR) 0.5 % ophthalmic solution Place 1 drop into the right eye every 6 (six) hours. 11/21/13   Baker, Adrian BlackwaterZachary H, PA-C  LORazepam (ATIVAN) 0.5 MG tablet Take 0.5 mg by mouth every 8 (eight) hours. Anxiety    [provider]  mesalamine (ASACOL) 400 MG EC tablet Take 1,200 mg by mouth daily.     [provider]  methocarbamol (ROBAXIN-750) 750 MG tablet Take 1 tablet (750 mg total) by mouth 4 (four) times daily. 05/10/12   Lorre NickAllen, Anthony, MD  Multiple Vitamins-Minerals (MULTIVITAMIN WITH MINERALS) tablet Take 1 tablet by mouth daily.    [provider]  ondansetron (ZOFRAN ODT) 8 MG disintegrating tablet Take 1 tablet (8 mg total) by mouth every 8 (eight) hours as needed for nausea. 05/10/12   Lorre NickAllen, Anthony, MD  oxyCODONE-acetaminophen (PERCOCET/ROXICET) 5-325 MG per tablet Take 2 tablets by mouth every 4  (four) hours as needed for pain. 05/10/12   Lorre NickAllen, Anthony, MD  sertraline (ZOLOFT) 100 MG tablet Take 100 mg by mouth daily.    [provider]  trimethoprim-polymyxin b (POLYTRIM) ophthalmic solution Place 1 drop into the right eye every 3 (three) hours. While awake, up to 6 doses per [place] 11/21/13   Graylon GoodBaker, Zachary H, PA-C  valACYclovir (VALTREX) 500 MG tablet Take 500 mg by mouth daily. 05/01/11   Nelwyn SalisburyFry, Stephen A, MD  Vitamin D, Ergocalciferol, (DRISDOL) 50000 UNITS CAPS Take 50,000 Units by mouth every 7 (seven) days. monday    [provider]  Vitamins A D C CHEW Chew 1 tablet by mouth daily.    [provider]    Family History Family History  Problem Relation Age of Onset  . Colon cancer Mother   . Colon polyps Brother   . Colon cancer Maternal Grandmother     Social History Social History   Tobacco Use  . Smoking status: Former Smoker    Types: Cigarettes  . Smokeless tobacco: Never Used  Substance Use Topics  . Alcohol use: Yes    Alcohol/week: 1.5 oz    Types: 3 Standard drinks or equivalent per week  . Drug use: No     Allergies   Nsaids  Review of Systems Review of Systems  HENT: Negative.   Respiratory: Negative.   Cardiovascular: Negative.   Gastrointestinal: Positive for abdominal pain.  Neurological: Negative.   Psychiatric/Behavioral: Negative.      Physical Exam Triage Vital Signs ED Triage Vitals [05/30/17 1747]  Enc Vitals Group     BP (!) 135/98     Pulse Rate 83     Resp 18     Temp 98.4 F (36.9 C)     Temp src      SpO2 100 %     Weight      Height      Head Circumference      Peak Flow      Pain Score      Pain Loc      Pain Edu?      Excl. in GC?    No data found.  Updated Vital Signs BP (!) 135/98   Pulse 83   Temp 98.4 F (36.9 C)   Resp 18   SpO2 100%   Visual Acuity Right Eye Distance:   Left Eye Distance:   Bilateral Distance:    Right Eye Near:   Left Eye Near:    Bilateral Near:      Physical Exam   UC Treatments / Results  Labs (all labs ordered are listed, but only abnormal results are displayed) Labs Reviewed - No data to display  EKG None Radiology No results found.  Procedures Procedures (including critical care time)  Medications Ordered in UC Medications - No data to display   Initial Impression / Assessment and Plan / UC Course  I have reviewed the triage vital signs and the nursing notes.  Pertinent labs & imaging results that were available during my care of the patient were reviewed by me and considered in my medical decision making (see chart for details).     Dysphagia, ?stricture Will refer to GI Glucagon 1 ml,now  Final Clinical Impressions(s) / UC Diagnoses   Final diagnoses:  None    ED Discharge Orders    None       Controlled Substance Prescriptions [place] Controlled Substance Registry consulted? Not Applicable   Frederica Kuster, MD 05/30/17 Elfredia Nevins

## 2017-05-30 NOTE — ED Notes (Signed)
Per dr. Carmelia RollerWendling, pt informed to wait 10-15 minutes and he will come reevaluate her.

## 2017-05-30 NOTE — ED Triage Notes (Signed)
Pt takes a medicine for UC, has had trouble swallowing the pill, it used to be two small pills and now its one big pill. Pt states "usually if I have trouble swallowing it, it goes away, but now all Care if I take sips of water it just comes up".

## 2018-07-03 ENCOUNTER — Encounter (HOSPITAL_COMMUNITY): Payer: Self-pay | Admitting: Emergency Medicine

## 2018-07-03 ENCOUNTER — Emergency Department (HOSPITAL_COMMUNITY): Payer: 59

## 2018-07-03 ENCOUNTER — Emergency Department (HOSPITAL_COMMUNITY)
Admission: EM | Admit: 2018-07-03 | Discharge: 2018-07-03 | Disposition: A | Discharge status: Home / Self Care | Payer: 59 | Attending: Emergency Medicine | Admitting: Emergency Medicine

## 2018-07-03 ENCOUNTER — Other Ambulatory Visit: Payer: Self-pay

## 2018-07-03 DIAGNOSIS — R609 Edema, unspecified: Secondary | ICD-10-CM | POA: Diagnosis not present

## 2018-07-03 DIAGNOSIS — Z87891 Personal history of nicotine dependence: Secondary | ICD-10-CM | POA: Diagnosis not present

## 2018-07-03 DIAGNOSIS — Z79899 Other long term (current) drug therapy: Secondary | ICD-10-CM | POA: Diagnosis not present

## 2018-07-03 DIAGNOSIS — R0789 Other chest pain: Secondary | ICD-10-CM | POA: Diagnosis not present

## 2018-07-03 DIAGNOSIS — R61 Generalized hyperhidrosis: Secondary | ICD-10-CM | POA: Diagnosis not present

## 2018-07-03 LAB — CBC
HCT: 50.7 % — ABNORMAL HIGH (ref 36.0–46.0)
Hemoglobin: 17 g/dL — ABNORMAL HIGH (ref 12.0–15.0)
MCH: 36.4 pg — ABNORMAL HIGH (ref 26.0–34.0)
MCHC: 33.5 g/dL (ref 30.0–36.0)
MCV: 108.6 fL — ABNORMAL HIGH (ref 80.0–100.0)
Platelets: 333 10*3/uL (ref 150–400)
RBC: 4.67 MIL/uL (ref 3.87–5.11)
RDW: 13.3 % (ref 11.5–15.5)
WBC: 8 10*3/uL (ref 4.0–10.5)
nRBC: 0 % (ref 0.0–0.2)

## 2018-07-03 LAB — TROPONIN I
Troponin I: <0.03 ng/mL (ref <0.03)
Troponin I: <0.03 ng/mL (ref <0.03)

## 2018-07-03 LAB — D-DIMER, QUANTITATIVE: D-Dimer, Quant: 0.4 ug/mL-FEU (ref 0.00–0.50)

## 2018-07-03 LAB — BASIC METABOLIC PANEL
Anion gap: 17 — ABNORMAL HIGH (ref 5–15)
BUN: <5 mg/dL — ABNORMAL LOW (ref 6–20)
CO2: 24 mmol/L (ref 22–32)
Calcium: 9.6 mg/dL (ref 8.9–10.3)
Chloride: 98 mmol/L (ref 98–111)
Creatinine, Ser: 0.93 mg/dL (ref 0.44–1.00)
GFR calc Af Amer: >60 mL/min (ref >60)
GFR calc non Af Amer: >60 mL/min (ref >60)
Glucose, Bld: 102 mg/dL — ABNORMAL HIGH (ref 70–99)
Potassium: 3.2 mmol/L — ABNORMAL LOW (ref 3.5–5.1)
Sodium: 139 mmol/L (ref 135–145)

## 2018-07-03 MED ORDER — SODIUM CHLORIDE 0.9% FLUSH: 3.0000 mL | Freq: Once | INTRAVENOUS | Status: DC

## 2018-07-03 NOTE — ED Provider Notes (Signed)
[facility]   CSN: [id number] Arrival date & time: 07/03/18  1326    History   Chief Complaint Chief Complaint  Patient presents with  . Chest Pain    rib area    HPI Shelley Berry is a 63 y.o. female history of ulcerative colitis presents to emergency department today with chief complaint of chest pain.  Patient states the chest pain has been intermittent x2 months, however it woke her up from her sleep this morning and has been worse today.  She states the pain is located on the left side of her chest under her left breast and it feels like a stabbing pain. The pain does not radiate. She rates the pain 6 out of 10 in severity.  She states this morning it woke her up from her sleep and lasted approximately 2 hours.  The pain subsided however returned and had been constant for at least 3 hours prior to arrival.  Patient admits to feeling anxious and diaphoretic however denies any nausea or vomiting, abdominal pain, back pain, jaw pain.  Patient denies any sick contacts. She has been checking her temperature every [place] and reports she has been afebrile for 1 month.  Patient has seen cardiologist Dr. Jacinto HalimGanji last visit 4 years ago.  At that time she had an echo that showed LV EF: 60% -   65%.  She denies any other cardiac work-up. History provided by patient with additional history obtained from chart review.    Past Medical History:  Diagnosis Date  . Genital herpes   . Ulcerative colitis onset 1998   sees Dr. Elsie AmisNat Mann    Patient Active Problem List   Diagnosis Date Noted  . Ulcerative colitis 12/24/2010    Past Surgical History:  Procedure Laterality Date  . ABDOMINAL HYSTERECTOMY  2000   TAH, ovaries intact   . COLONOSCOPY  June 2012   per Dr. Loreta AveMann, normal, repeat in 2 yrs   . PRE-MALIGNANT / BENIGN SKIN LESION EXCISION     several on the back and right thigh      OB History   No obstetric history on file.      Home  Medications    Prior to Admission medications   Medication Sig Start Date End Date Taking? Authorizing Provider  Cyanocobalamin (VITAMIN B-12 PO) Take 1 tablet by mouth daily.   Yes [provider]  furosemide (LASIX) 20 MG tablet Take 20 mg by mouth daily. 07/01/18  Yes [provider]  omeprazole (PRILOSEC) 40 MG capsule Take 40 mg by mouth daily. 04/17/18  Yes [provider]  valACYclovir (VALTREX) 1000 MG tablet Take 1,000 mg by mouth daily. 05/01/18  Yes [provider]  VITAMIN D PO Take 1 tablet by mouth daily.   Yes [provider]  dicyclomine (BENTYL) 20 MG tablet Take 1 tablet (20 mg total) by mouth 2 (two) times daily. Patient not taking: Reported on 07/03/2018 05/30/17   Frederica KusterMiller, Stephen M, MD  ketorolac (ACULAR) 0.5 % ophthalmic solution Place 1 drop into the right eye every 6 (six) hours. Patient not taking: Reported on 07/03/2018 11/21/13   Graylon GoodBaker, Zachary H, PA-C  methocarbamol (ROBAXIN-750) 750 MG tablet Take 1 tablet (750 mg total) by mouth 4 (four) times daily. Patient not taking: Reported on 07/03/2018 05/10/12   Lorre NickAllen, Anthony, MD  ondansetron (ZOFRAN ODT) 8 MG disintegrating tablet Take 1 tablet (8 mg total) by mouth every 8 (eight) hours  as needed for nausea. Patient not taking: Reported on 07/03/2018 05/10/12   Lorre Nick, MD  oxyCODONE-acetaminophen (PERCOCET/ROXICET) 5-325 MG per tablet Take 2 tablets by mouth every 4 (four) hours as needed for pain. Patient not taking: Reported on 07/03/2018 05/10/12   Lorre Nick, MD  trimethoprim-polymyxin b [facility]) ophthalmic solution Place 1 drop into the right eye every 3 (three) hours. While awake, up to 6 doses per [place] Patient not taking: Reported on 07/03/2018 11/21/13   Graylon Good, PA-C    Family History Family History  Problem Relation Age of Onset  . Colon cancer Mother   . Colon polyps Brother   . Colon cancer Maternal Grandmother     Social History Social History   Tobacco  Use  . Smoking status: Former Smoker    Types: Cigarettes  . Smokeless tobacco: Never Used  Substance Use Topics  . Alcohol use: Yes    Alcohol/week: 3.0 standard drinks    Types: 3 Standard drinks or equivalent per week  . Drug use: No     Allergies   Nsaids   Review of Systems Review of Systems  Constitutional: Positive for diaphoresis. Negative for chills and fever.  HENT: Negative for congestion, ear discharge, ear pain, sinus pressure, sinus pain and sore throat.   Eyes: Negative for pain and redness.  Respiratory: Negative for cough and shortness of breath.   Cardiovascular: Positive for chest pain.  Gastrointestinal: Negative for abdominal pain, constipation, diarrhea, nausea and vomiting.  Genitourinary: Negative for dysuria and hematuria.  Musculoskeletal: Negative for back pain and neck pain.  Skin: Negative for wound.  Neurological: Negative for weakness, numbness and headaches.     Physical Exam Updated Vital Signs BP 127/89   Pulse 75   Temp 98 F (36.7 C) (Oral)   Resp 13   SpO2 100%   Physical Exam Vitals signs and nursing note reviewed.  HENT:     Head: Normocephalic and atraumatic.     Nose: Nose normal.     Mouth/Throat:     Mouth: Mucous membranes are moist.     Pharynx: Oropharynx is clear.  Eyes:     General: No scleral icterus.    Conjunctiva/sclera: Conjunctivae normal.  Neck:     Musculoskeletal: Normal range of motion.  Cardiovascular:     Rate and Rhythm: Normal rate and regular rhythm.     Pulses: Normal pulses.          Radial pulses are 2+ on the right side and 2+ on the left side.       Dorsalis pedis pulses are 2+ on the right side and 2+ on the left side.     Heart sounds: Normal heart sounds.  Pulmonary:     Effort: Pulmonary effort is normal.     Breath sounds: Normal breath sounds. No wheezing, rhonchi or rales.  Chest:     Chest wall: No tenderness.  Abdominal:     General: There is no distension.     Palpations:  Abdomen is soft.     Tenderness: There is no abdominal tenderness. There is no guarding.  Musculoskeletal: Normal range of motion.     Right lower leg: 1+ Pitting Edema present.     Left lower leg: 1+ Pitting Edema present.     Comments: Negative Homans sign bilaterally.  Bilateral calves nontender to palpation.  Patient has chronic lower extremity edema, it is unchanged today  Skin:    General: Skin is warm and dry.  Neurological:     Mental Status: She is alert and oriented to person, place, and time.  Psychiatric:        Behavior: Behavior normal.      ED Treatments / Results  Labs (all labs ordered are listed, but only abnormal results are displayed) Labs Reviewed  BASIC METABOLIC PANEL - Abnormal; Notable for the following components:      Result Value   Potassium 3.2 (*)    Glucose, Bld 102 (*)    BUN <5 (*)    Anion gap 17 (*)    All other components within normal limits  CBC - Abnormal; Notable for the following components:   Hemoglobin 17.0 (*)    HCT 50.7 (*)    MCV 108.6 (*)    MCH 36.4 (*)    All other components within normal limits  TROPONIN I  TROPONIN I  D-DIMER, QUANTITATIVE (NOT AT [facility])    EKG EKG Interpretation  Date/Time:  Friday Jul 03 2018 13:43:19 EDT Ventricular Rate:  81 PR Interval:  156 QRS Duration: 72 QT Interval:  386 QTC Calculation: 448 R Axis:   -18 Text Interpretation:  Normal sinus rhythm with sinus arrhythmia Nonspecific ST and T wave abnormality Abnormal ECG No STEMI.  Confirmed by Alona Bene [phone]) on 07/03/2018 5:33:41 PM    Radiology Dg Chest 2 View  Result Date: 07/03/2018 CLINICAL DATA:  63 year old female with stabbing chest pain under the left breast intermittently for the past 2 months. EXAM: CHEST - 2 VIEW COMPARISON:  None. FINDINGS: The lungs are clear and negative for focal airspace consolidation, pulmonary edema or suspicious pulmonary nodule. No pleural effusion or pneumothorax. Cardiac and mediastinal contours  are within normal limits. No acute fracture or lytic or blastic osseous lesions. The visualized upper abdominal bowel gas pattern is unremarkable. IMPRESSION: Negative chest x-ray. Electronically Signed   By: Malachy Moan M.D.   On: 07/03/2018 14:12    Procedures Procedures (including critical care time)  Medications Ordered in ED Medications  sodium chloride flush (NS) 0.9 % injection 3 mL (has no administration in time range)     Initial Impression / Assessment and Plan / ED Course  I have reviewed the triage vital signs and the nursing notes.  Pertinent labs & imaging results that were available during my care of the patient were reviewed by me and considered in my medical decision making (see chart for details).  63 year old female presents with chest pain x2 months. Patient presents to the emergency department with chest pain. Patient nontoxic appearing, in no apparent distress, vitals without significant abnormality. Fairly benign physical exam. DDX: ACS, pulmonary embolism, dissection, pneumothorax, effusion, infiltrate, arrhythmia, electrolyte derangement, MSK. Evaluation initiated with labs, EKG, and CXR. Patient on cardiac monitor.   Work-up in the ER unremarkable. Labs reviewed, no leukocytosis, or significant electrolyte abnormality.  Upper kalemia of 3.2, discussed with patient ways to include potassium rich foods in her diet and to have it rechecked by PCP. CXR without infiltrate, effusion, pneumothorax, or fracture/dislocation.   Low risk heart score of 3, EKG without obvious ischemia, delta troponin negative, doubt ACS. D-dimer is negative, doubt pulmonary embolism. Pain is not a tearing sensation, symmetric pulses, no widening of mediastinum on CXR, doubt dissection. Patient has appeared hemodynamically stable throughout ER visit and appears safe for discharge with close PCP follow up. I discussed results, treatment plan, need for PCP follow-up, and return precautions with  the patient. Provided opportunity for questions, patient confirmed understanding and  is in agreement with plan.   Case has been discussed with Dr. Jacqulyn Bath who agrees with the above plan to discharge.   This note was prepared with assistance of Conservation officer, historic buildings. Occasional wrong-word or sound-a-like substitutions may have occurred due to the inherent limitations of voice recognition software.    Final Clinical Impressions(s) / ED Diagnoses   Final diagnoses:  Atypical chest pain    ED Discharge Orders    None       Kathyrn Lass 07/03/18 1912    Maia Plan, MD 07/03/18 2107

## 2018-07-03 NOTE — Discharge Instructions (Signed)
Read instructions below for reasons to return to the Emergency Department. It is recommended that your follow up with your Primary Care Doctor in regards to today's visit.  Please follow-up with your primary care doctor to have your blood work rechecked within the next 2 weeks.  Her potassium today is slightly low at 3.2.  Also your blood showed signs of anemia.  It is important that you follow-up to have this rechecked.  I have included information about foods you can include in your diet to help increase your potassium.  Tests performed today include: An EKG of your heart A chest x-ray Cardiac enzymes - a blood test for heart muscle damage Blood counts and electrolytes   Chest Pain (Nonspecific)  HOME CARE INSTRUCTIONS  -For the next few days, avoid physical activities that bring on chest pain. Continue physical activities as directed.  -Follow your caregiver's suggestions for further testing if your chest pain does not go away.  -Keep any follow-up appointments you made. If you do not go to an appointment, you could develop lasting (chronic) problems with pain. If there is any problem keeping an appointment, you must call to reschedule.   SEEK MEDICAL CARE IF:  You think you are having problems from the medicine you are taking. Read your medicine instructions carefully.  Your chest pain does not go away, even after treatment.  You develop a rash with blisters on your chest.   SEEK IMMEDIATE MEDICAL CARE IF:  You have increased chest pain or pain that spreads to your arm, neck, jaw, back, or belly (abdomen).  You develop shortness of breath, an increasing cough, or you are coughing up blood.  You have severe back or abdominal pain, feel sick to your stomach (nauseous) or throw up (vomit).  You develop severe weakness, fainting, or chills.  You have an oral temperature above 102 F (38.9 C), not controlled by medicine.   THIS IS AN EMERGENCY. Do not wait to see if the pain will go  away. Get medical help at once. Call 911. Do not drive yourself to the hospital.

## 2018-07-03 NOTE — ED Triage Notes (Addendum)
Patient reports that she has had intermittent left side chest-pain X 2 months. At this moment patient states the pain is a 0. She reports that she called some "Doctor friends of mine" who told her she needs to come to the ED Denies shortness of breath, denies nausea, denies vomiting, denies dizziness.

## 2018-07-03 NOTE — ED Notes (Signed)
Patient Alert and oriented to baseline. Stable and ambulatory to baseline. Patient verbalized understanding of the discharge instructions.  Patient belongings were taken by the patient.   

## 2019-03-01 ENCOUNTER — Ambulatory Visit: Payer: Managed Care, Other (non HMO) | Attending: Internal Medicine

## 2019-03-01 DIAGNOSIS — Z20822 Contact with and (suspected) exposure to covid-19: Secondary | ICD-10-CM

## 2019-03-03 LAB — NOVEL CORONAVIRUS, NAA: SARS-CoV-2, NAA: NOT DETECTED

## 2019-04-27 ENCOUNTER — Ambulatory Visit: Payer: Managed Care, Other (non HMO) | Attending: Internal Medicine

## 2019-04-27 DIAGNOSIS — Z20822 Contact with and (suspected) exposure to covid-19: Secondary | ICD-10-CM | POA: Insufficient documentation

## 2019-04-28 LAB — NOVEL CORONAVIRUS, NAA: SARS-CoV-2, NAA: NOT DETECTED

## 2019-09-28 IMAGING — CR CHEST - 2 VIEW
2 series · 2 of 2 positions shown · non-contrast
Comparison: None.

CLINICAL DATA: 62-year-old female with stabbing chest pain under
the left breast intermittently for the past 2 months.

EXAM:
CHEST - 2 VIEW

[chest pa]
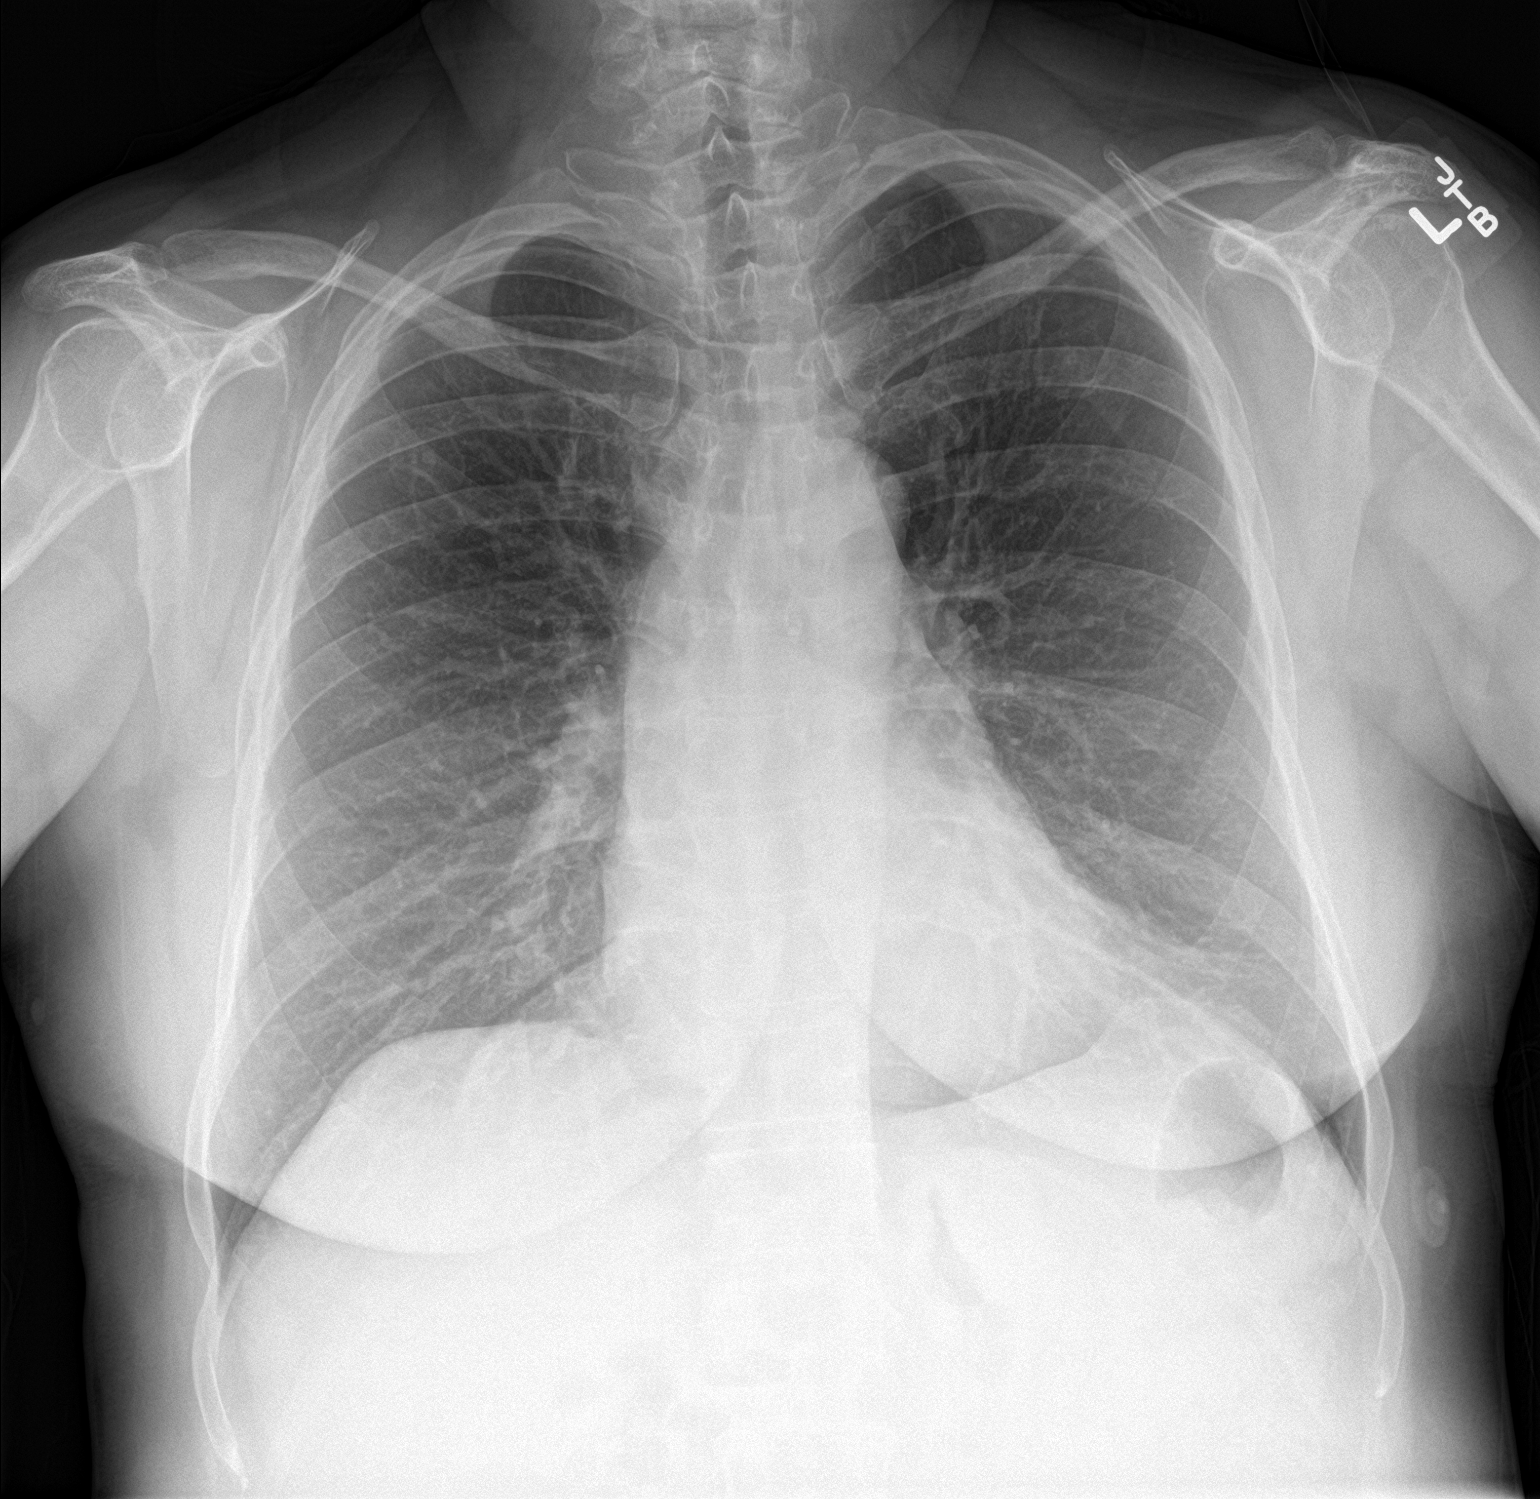

[chest lat]
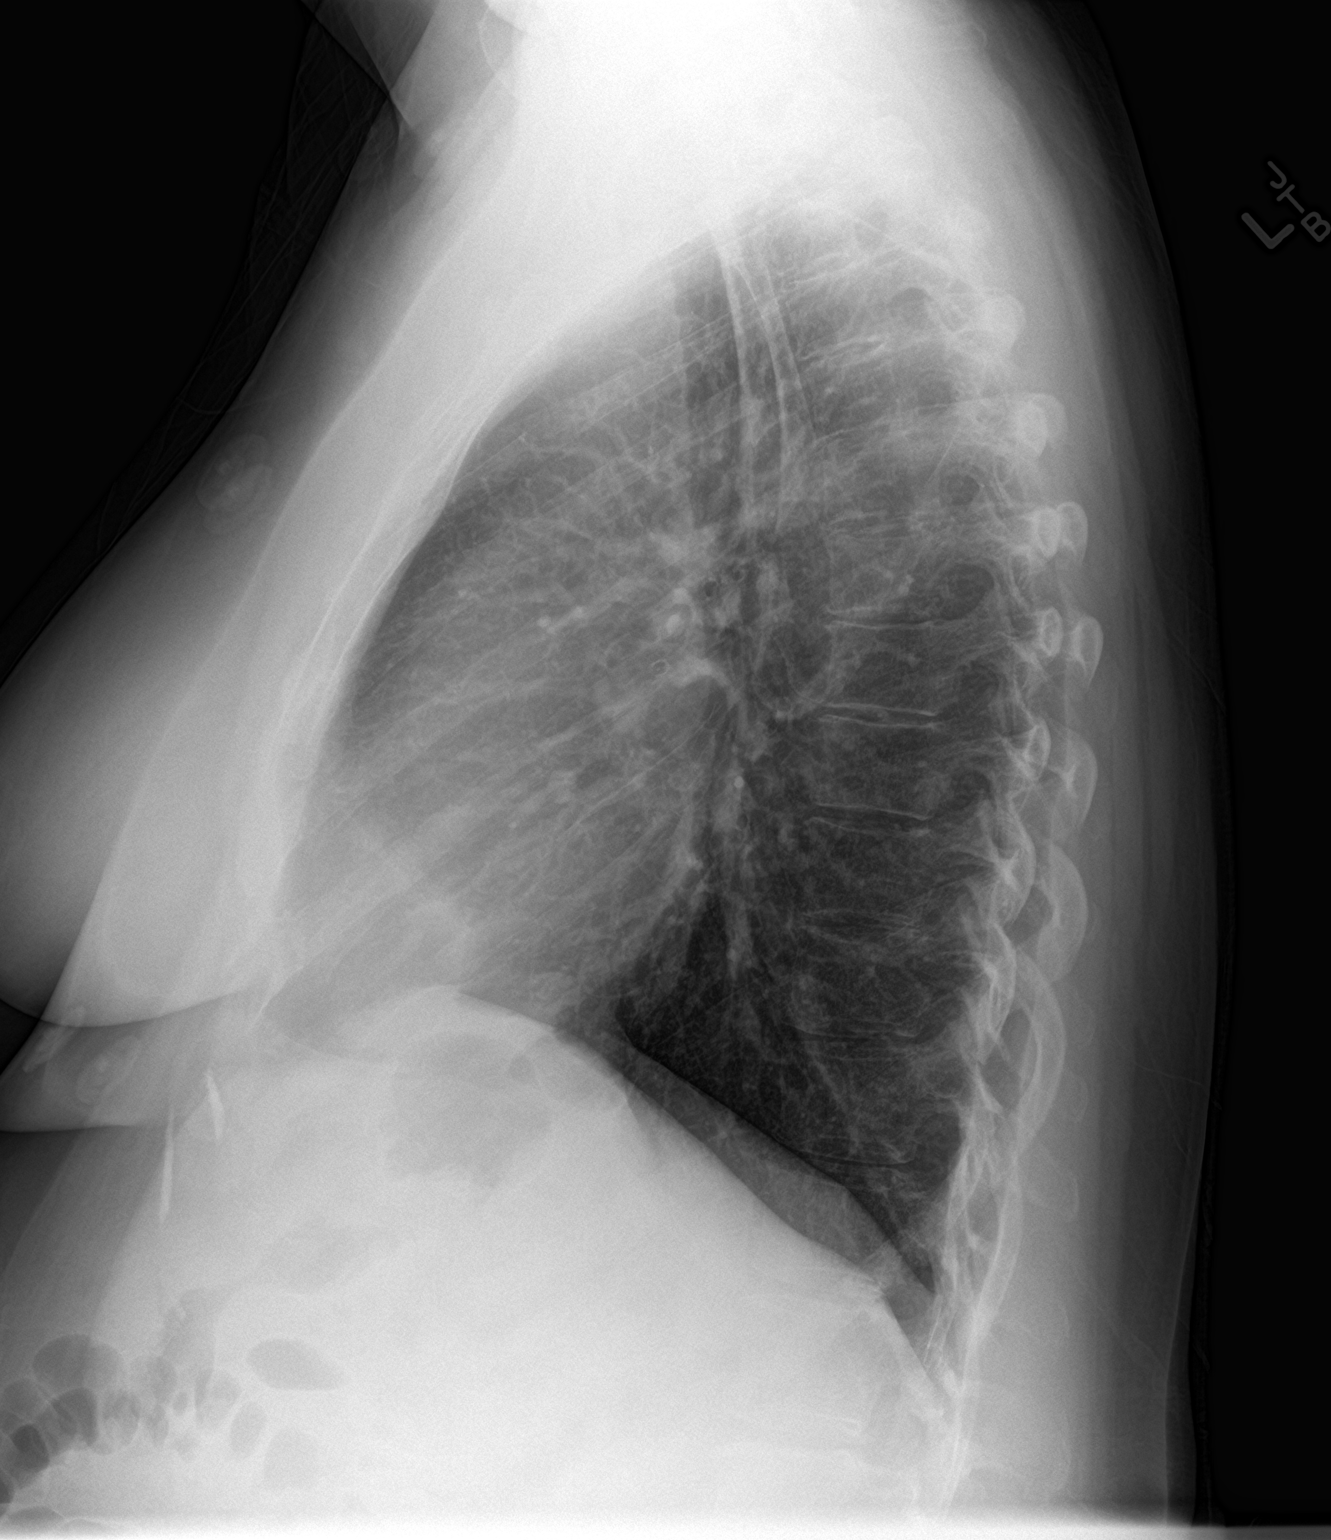

[2 of 2 positions shown; findings below may reference images not displayed]

FINDINGS: The lungs are clear and negative for focal airspace consolidation,
pulmonary edema or suspicious pulmonary nodule. No pleural effusion
or pneumothorax. Cardiac and mediastinal contours are within normal
limits. No acute fracture or lytic or blastic osseous lesions. The
visualized upper abdominal bowel gas pattern is unremarkable.
IMPRESSION: Negative chest x-ray.

## 2021-05-04 ENCOUNTER — Other Ambulatory Visit: Payer: Self-pay | Admitting: Registered Nurse

## 2021-05-04 DIAGNOSIS — E7800 Pure hypercholesterolemia, unspecified: Secondary | ICD-10-CM

## 2022-07-18 ENCOUNTER — Other Ambulatory Visit (HOSPITAL_BASED_OUTPATIENT_CLINIC_OR_DEPARTMENT_OTHER): Payer: Self-pay | Admitting: Registered Nurse

## 2022-07-18 DIAGNOSIS — E7800 Pure hypercholesterolemia, unspecified: Secondary | ICD-10-CM

## 2023-01-06 ENCOUNTER — Other Ambulatory Visit: Payer: Self-pay | Admitting: Registered Nurse

## 2023-01-06 DIAGNOSIS — Z87891 Personal history of nicotine dependence: Secondary | ICD-10-CM

## 2023-01-20 ENCOUNTER — Inpatient Hospital Stay: Admission: RE | Admit: 2023-01-20 | Payer: Managed Care, Other (non HMO) | Source: Ambulatory Visit

## 2023-07-25 ENCOUNTER — Other Ambulatory Visit (HOSPITAL_COMMUNITY): Payer: Self-pay | Admitting: Registered Nurse

## 2023-07-25 DIAGNOSIS — E7800 Pure hypercholesterolemia, unspecified: Secondary | ICD-10-CM

## 2023-08-12 NOTE — Progress Notes (Unsigned)
   Joanna Muck, PhD, LAT, ATC acting as a scribe for Garlan Juniper, MD.  Shelley Berry is a 68 y.o. female who presents to [facility] at [facility] today for R hip pain   Pertinent review of systems: ***  Relevant historical information: ***   Exam:  There were no vitals taken for this visit. General: Well Developed, well nourished, and in no acute distress.   MSK: ***    Lab and Radiology Results No results found for this or any previous visit (from the past 72 hours). No results found.     Assessment and Plan: 68 y.o. female with ***   PDMP not reviewed this encounter. No orders of the defined types were placed in this encounter.  No orders of the defined types were placed in this encounter.    Discussed warning signs or symptoms. Please see discharge instructions. Patient expresses understanding.   ***

## 2023-08-13 ENCOUNTER — Ambulatory Visit (INDEPENDENT_AMBULATORY_CARE_PROVIDER_SITE_OTHER)

## 2023-08-13 ENCOUNTER — Other Ambulatory Visit: Payer: Self-pay

## 2023-08-13 ENCOUNTER — Ambulatory Visit: Admitting: Family Medicine

## 2023-08-13 VITALS — BP 126/86 | HR 82 | Ht 64.0 in | Wt 185.0 lb

## 2023-08-13 DIAGNOSIS — M25551 Pain in right hip: Secondary | ICD-10-CM | POA: Diagnosis not present

## 2023-08-13 NOTE — Patient Instructions (Addendum)
 Thank you for coming in today.   Please get an Xray today before you leave   I've referred you to Physical Therapy.  Let us  know if you don't hear from them in one week.   Recheck in 6 weeks.   Please let me know if this is not going well.   Ask your GI doctor about safety of Tumeric with UC.

## 2023-08-18 ENCOUNTER — Encounter (HOSPITAL_COMMUNITY): Payer: Self-pay

## 2023-08-18 ENCOUNTER — Other Ambulatory Visit (HOSPITAL_COMMUNITY)

## 2023-08-22 ENCOUNTER — Ambulatory Visit: Payer: Self-pay | Admitting: Family Medicine

## 2023-08-22 NOTE — Progress Notes (Signed)
 Right hip x-ray shows medium to severe arthritis.  During your visit on the 11th I thought the majority of the pain was from the tendinitis on the side of the hip which is not very visible on the x-ray.  I do think physical therapy is reasonable and if you do not feel better after completing a course of physical therapy we would want to try an injection into the hip joint.

## 2023-09-02 ENCOUNTER — Ambulatory Visit: Admitting: Physical Therapy

## 2023-09-02 NOTE — Therapy (Incomplete)
 OUTPATIENT PHYSICAL THERAPY EVALUATION   Patient Name: Shelley Berry MRN: [id number] DOB:21-Jul-1955, 68 y.o., female Today's Date: 09/02/2023   END OF SESSION:   Past Medical History:  Diagnosis Date   Genital herpes    Ulcerative colitis onset 1998   sees Dr. Ulysees Sous   Past Surgical History:  Procedure Laterality Date   ABDOMINAL HYSTERECTOMY  2000   TAH, ovaries intact    COLONOSCOPY  June 2012   per Dr. Sous, normal, repeat in 2 yrs    PRE-MALIGNANT / BENIGN SKIN LESION EXCISION     several on the back and right thigh    Patient Active Problem List   Diagnosis Date Noted   Ulcerative colitis (HCC) 12/24/2010    PCP: Royden Ronal Czar, FNP  REFERRING PROVIDER: Joane Artist RAMAN, MD  REFERRING DIAG: Right hip pain  Rationale for Evaluation and Treatment: Rehabilitation  THERAPY DIAG:  No diagnosis found.  ONSET DATE: ***   SUBJECTIVE:         SUBJECTIVE STATEMENT: ***  PERTINENT HISTORY:  See PMH above  PAIN:  Are you having pain? Yes:  NPRS scale: *** Pain location: *** Pain description: *** Aggravating factors: *** Relieving factors: ***  PRECAUTIONS: None  RED FLAGS: None   WEIGHT BEARING RESTRICTIONS: No  FALLS:  Has patient fallen in last 6 months? {fallsyesno:27318}  OCCUPATION: ***  PLOF: Independent  PATIENT GOALS: ***   OBJECTIVE:  Note: Objective measures were completed at Evaluation unless otherwise noted. PATIENT SURVEYS:  ***  COGNITION: Overall cognitive status: Within functional limits for tasks assessed     SENSATION: WFL  MUSCLE LENGTH: ***  POSTURE:   ***  PALPATION: ***  LUMBAR ROM:   AROM eval  Flexion   Extension   Right lateral flexion   Left lateral flexion   Right rotation   Left rotation    (Blank rows = not tested)  LOWER EXTREMITY ROM:     {AROM/PROM:27142}  Right eval Left eval  Hip flexion    Hip extension    Hip abduction    Hip adduction    Hip internal rotation    Hip  external rotation    Knee flexion    Knee extension    Ankle dorsiflexion    Ankle plantarflexion    Ankle inversion    Ankle eversion     (Blank rows = not tested)  LOWER EXTREMITY MMT:    MMT Right eval Left eval  Hip flexion    Hip extension    Hip abduction    Hip adduction    Hip internal rotation    Hip external rotation    Knee flexion    Knee extension    Ankle dorsiflexion    Ankle plantarflexion    Ankle inversion    Ankle eversion     (Blank rows = not tested)  LUMBAR SPECIAL TESTS:  {lumbar special test:25242}  FUNCTIONAL TESTS:  {Functional tests:24029}  GAIT: Distance walked: *** Assistive device utilized: {Assistive devices:23999} Level of assistance: {Levels of assistance:24026} Comments: ***   TREATMENT  [facility] Adult PT Treatment:                                                DATE: 09/02/2023 ***  PATIENT EDUCATION:  Education details: Exam findings, POC, HEP Person educated: Patient Education method: Explanation, Demonstration,  Tactile cues, Verbal cues, and Handouts Education comprehension: verbalized understanding, returned demonstration, verbal cues required, tactile cues required, and needs further education  HOME EXERCISE PROGRAM: ***   ASSESSMENT: CLINICAL IMPRESSION: Patient is a 68 y.o. female who was seen today for physical therapy evaluation and treatment for ***.   OBJECTIVE IMPAIRMENTS: {opptimpairments:25111}.   ACTIVITY LIMITATIONS: {activitylimitations:27494}  PARTICIPATION LIMITATIONS: {participationrestrictions:25113}  PERSONAL FACTORS: {Personal factors:25162} are also affecting patient's functional outcome.   REHAB POTENTIAL: {rehabpotential:25112}  CLINICAL DECISION MAKING: {clinical decision making:25114}  EVALUATION COMPLEXITY: {Evaluation complexity:25115}   GOALS: Goals reviewed with patient? Yes  SHORT TERM GOALS: Target date: 09/30/2023  Patient will be I with initial HEP in order to progress with  therapy. Baseline: HEP provided at eval Goal status: INITIAL  2.  *** Baseline:  Goal status: INITIAL  3.  *** Baseline:  Goal status: INITIAL  LONG TERM GOALS: Target date: 10/28/2023  Patient will be I with final HEP to maintain progress from PT. Baseline: HEP provided at eval Goal status: INITIAL  2.  *** Baseline:  Goal status: INITIAL  3.  *** Baseline:  Goal status: INITIAL  4.  *** Baseline:  Goal status: INITIAL   PLAN: PT FREQUENCY: {rehab frequency:25116}  PT DURATION: 8 weeks  PLANNED INTERVENTIONS: {rehab planned interventions:25118::97110-Therapeutic exercises,97530- Therapeutic [phone]- Neuromuscular re-education,97535- Self Rjmz,02859- Manual therapy}.  PLAN FOR NEXT SESSION: Review HEP and progress PRN, ***   Elaine Daring, PT, DPT, LAT, ATC 09/02/23  7:52 AM Phone: [phone] Fax: [phone]

## 2023-09-24 ENCOUNTER — Ambulatory Visit: Admitting: Family Medicine

## 2023-09-24 ENCOUNTER — Encounter: Payer: Self-pay | Admitting: Family Medicine

## 2023-09-24 VITALS — BP 110/80 | HR 86 | Ht 64.0 in

## 2023-09-24 DIAGNOSIS — M1611 Unilateral primary osteoarthritis, right hip: Secondary | ICD-10-CM

## 2023-09-24 DIAGNOSIS — M25551 Pain in right hip: Secondary | ICD-10-CM | POA: Diagnosis not present

## 2023-09-24 NOTE — Progress Notes (Unsigned)
   I, Leotis Batter, CMA acting as a scribe for Artist Lloyd, MD.  Shelley Berry is a 68 y.o. female who presents to [facility] at [facility] today for f/u R hip pain. Pt was last seen by Dr. Lloyd on 08/13/23 and was referred to PT, but she canceled her 1st visit.  Today, pt reports continued right hip pain, located at lateral aspect of the hip. Was unable to make it to her PT initial eval d/t her dog having a hear attack. She kept meaning to r/s but time got away. Has tried Tylenol  with minimal relief. Feels that she had severe arthritis in the hip and is not sure that PT will be helpful. Would like to discuss other options today.   Her pain is not located in the anterior hip.  The majority of her pain is in the lateral hip.  Dx imaging: 08/13/23 R hip XR  Pertinent review of systems: No fevers or chills  Relevant historical information: Ulcerative colitis   Exam:  BP 110/80   Pulse 86   Ht 5' 4 (1.626 m)   SpO2 96%   BMI 31.76 kg/m  General: Well Developed, well nourished, and in no acute distress.   MSK: Right hip normal-appearing decreased range of motion lacking full flexion and rotation.    Lab and Radiology Results   EXAM: DG HIP (WITH OR WITHOUT PELVIS) 2-3V RIGHT   COMPARISON:  None Available.   FINDINGS: SI joint degenerative change. Pubic symphysis and rami appear intact. Mild left hip degenerative change. No fracture or malalignment. Moderate severe arthropathy of the femoroacetabular joint, superiorly bone on bone contact and subarticular sclerosis.   IMPRESSION: Moderate to severe right hip degenerative change.     Electronically Signed   By: Luke Bun M.D.   On: 08/21/2023 23:33 I, Artist Lloyd, personally (independently) visualized and performed the interpretation of the images attached in this note.    Assessment and Plan: 68 y.o. female with right lateral hip pain.  She does have considerable arthritis on x-ray of her right  hip.  However the majority of her pain remains in the lateral hip which is not typical for hip arthritis.  We talked about options.  I do think a limited trial of physical therapy is warranted.  This could help her lateral hip pain improve and increase her time to surgery.  At the very least he could act like prehab prior to hip replacement surgery.  We did talk about a diagnostic and therapeutic injection.  She wants to hold off on that for now which I think is perfectly fine.  If not improved she thinks she probably will go more straight to surgery which I think is reasonable.  Consider referral to [facility] care in the future if needed.   PDMP not reviewed this encounter. Orders Placed This Encounter  Procedures   Ambulatory referral to Physical Therapy    Referral Priority:   Routine    Referral Type:   Physical Medicine    Referral Reason:   Specialty Services Required    Requested Specialty:   Physical Therapy    Number of Visits Requested:   1   No orders of the defined types were placed in this encounter.    Discussed warning signs or symptoms. Please see discharge instructions. Patient expresses understanding.   The above documentation has been reviewed and is accurate and complete Artist Lloyd, M.D.

## 2023-09-24 NOTE — Patient Instructions (Addendum)
 Thank you for coming in today.   I've referred you to Physical Therapy here, at this office.  You will hear from our office soon about scheduling, once we check with your insurance company.

## 2023-10-08 ENCOUNTER — Ambulatory Visit: Admitting: Physical Therapy

## 2023-10-08 ENCOUNTER — Encounter: Payer: Self-pay | Admitting: Physical Therapy

## 2023-10-08 ENCOUNTER — Other Ambulatory Visit: Payer: Self-pay

## 2023-10-08 DIAGNOSIS — M6281 Muscle weakness (generalized): Secondary | ICD-10-CM

## 2023-10-08 DIAGNOSIS — M25551 Pain in right hip: Secondary | ICD-10-CM

## 2023-10-08 NOTE — Patient Instructions (Signed)
 Access Code: [email or web address] URL: [email or web address] Date: 10/08/2023 Prepared by: Elaine Daring  Exercises - Supine Piriformis Stretch with Foot on Ground  - 1 x daily - 3 reps - 30 seconds hold - Clam with Resistance  - 1 x daily - 2 sets - 10 reps - Bridge  - 1 x daily - 2 sets - 10 reps - Active Straight Leg Raise with Quad Set  - 1 x daily - 2 sets - 10 reps

## 2023-10-08 NOTE — Therapy (Signed)
 OUTPATIENT PHYSICAL THERAPY EVALUATION   Patient Name: Shelley Berry MRN: [id number] DOB:28-Dec-1955, 68 y.o., female Today's Date: 10/09/2023   END OF SESSION:  PT End of Session - 10/08/23 1612     Visit Number 1    Number of Visits 9    Date for PT Re-Evaluation 12/03/23    Authorization Type [employer]    PT Start Time 1604    PT Stop Time 1645    PT Time Calculation (min) 41 min    Activity Tolerance Patient tolerated treatment well    Behavior During Therapy [facility] for tasks assessed/performed          Past Medical History:  Diagnosis Date   Genital herpes    Ulcerative colitis onset 1998   sees Dr. Ulysees Sous   Past Surgical History:  Procedure Laterality Date   ABDOMINAL HYSTERECTOMY  2000   TAH, ovaries intact    COLONOSCOPY  June 2012   per Dr. Sous, normal, repeat in 2 yrs    PRE-MALIGNANT / BENIGN SKIN LESION EXCISION     several on the back and right thigh    Patient Active Problem List   Diagnosis Date Noted   Ulcerative colitis (HCC) 12/24/2010    PCP: Royden Ronal Czar, FNP  REFERRING PROVIDER: Joane Artist RAMAN, MD  REFERRING DIAG: Right hip pain  THERAPY DIAG:  Pain in right hip  Muscle weakness (generalized)  Rationale for Evaluation and Treatment: Rehabilitation  ONSET DATE: Chronic, ongoing about a year   SUBJECTIVE:  SUBJECTIVE STATEMENT: Patient reports she started walking weird maybe a year ago and she also feels like she doesn't have full range of motion on the right side when putting her socks on. She also walks up stairs weird. She works from home and goes up down the stairs and she has to go down stairs one at a time. She also notes pain in the right hip when she sleeps on that side. When she gets up out of chair she has to stand for a bit before she starts walking because she gets stiff. She has been trying to walk more and that didn't help too much, and it does feel like her knees are being affected by her hip because she is walking  weird.   PERTINENT HISTORY: See PMH above  PAIN:  Are you having pain? Yes:  NPRS scale: 3/10 Pain location: Right hip Pain description: Stiff Aggravating factors: Walking, stairs, putting on socks Relieving factors: Stretching  PRECAUTIONS: None  RED FLAGS: None   WEIGHT BEARING RESTRICTIONS: No  FALLS:  Has patient fallen in last 6 months? No  OCCUPATION: Works from home  PLOF: Independent  PATIENT GOALS: Improve walking and going up/down stairs   OBJECTIVE:  Note: Objective measures were completed at Evaluation unless otherwise noted. PATIENT SURVEYS:  PSFS: 4.25 Putting on shoes/socks: 4 Bending completely to wash feet/shave: 3 Walking smoothly: 4 Going up and down stairs: 6  COGNITION: Overall cognitive status: Within functional limits for tasks assessed     SENSATION: WFL  MUSCLE LENGTH: Slight limitation with bilateral hamstring, right hip flexor and quad and piriformis flexibility  POSTURE:   Grossly WFL  PALPATION: Mildly tender to lateral aspect of right hip  Trial LAD on right and patient reports pulling in right lower back region  LOWER EXTREMITY ROM: Right hip PROM IR and extension slightly limited compared to left  LOWER EXTREMITY MMT:  MMT Right eval Left eval  Hip flexion 4- 4  Hip  extension 3 4-  Hip abduction 4- 4  Hip adduction    Hip internal rotation    Hip external rotation    Knee flexion 4+ 5  Knee extension 5 5  Ankle dorsiflexion    Ankle plantarflexion    Ankle inversion    Ankle eversion     (Blank rows = not tested)  FUNCTIONAL TESTS:  5 times sit to stand: 11 seconds  GAIT: Assistive device utilized: None Level of assistance: Complete Independence Comments: Antalgic on right, reduced hip extension on right                                                                                                                               TREATMENT [facility] Adult PT Treatment:                                                 DATE: 10/08/2023 Piriformis stretch x 30 sec each Side clamshell with green x 10 on right Bridge x 10 SLR x 10 on right  PATIENT EDUCATION:  Education details: Exam findings, POC, HEP Person educated: Patient Education method: Explanation, Demonstration, Tactile cues, Verbal cues, and Handouts Education comprehension: verbalized understanding, returned demonstration, verbal cues required, tactile cues required, and needs further education  HOME EXERCISE PROGRAM: Access Code: [email or web address]    ASSESSMENT: CLINICAL IMPRESSION: Patient is a 68 y.o. female who was seen today for physical therapy evaluation and treatment for chronic right hip pain. Currently she exhibits slight limitations in hip motion, gross flexibility deficits of the right hip, gross strength deficits of the right hip, gait deviations that are likely contributing to her pain and impacting her functional ability.   OBJECTIVE IMPAIRMENTS: Abnormal gait, decreased activity tolerance, decreased balance, decreased ROM, decreased strength, impaired flexibility, and pain.   ACTIVITY LIMITATIONS: carrying, lifting, sitting, standing, squatting, sleeping, stairs, dressing, and locomotion level  PARTICIPATION LIMITATIONS: meal prep, cleaning, shopping, and community activity  PERSONAL FACTORS: Fitness, Past/current experiences, and Time since onset of injury/illness/exacerbation are also affecting patient's functional outcome.   REHAB POTENTIAL: Good  CLINICAL DECISION MAKING: Stable/uncomplicated  EVALUATION COMPLEXITY: Low   GOALS: Goals reviewed with patient? Yes  SHORT TERM GOALS: Target date: 11/05/2023  Patient will be I with initial HEP in order to progress with therapy. Baseline: HEP provided at eval Goal status: INITIAL  2.  Patient will report right hip pain </= 1/10 in order to reduce functional limitations Baseline: 3/10 pain Goal status: INITIAL  LONG TERM GOALS: Target date: 12/03/2023  Patient  will be I with final HEP to maintain progress from PT. Baseline: HEP provided at eval Goal status: INITIAL  2.  Patient will report PSFS >/= 7 in order to indicate improvement in their functional ability. Baseline: 4.25 Goal status: INITIAL  3.  Patient will demonstrate right  hip strength grossly>/= 4/5 MMT in order to improve walking tolerance and stair negotiation Baseline: see limitations above Goal status: INITIAL  4.  Patient will demonstrate right hip PROM grossly equal to left without pain to improve ability to put on socks/shoes Baseline: see limitations above Goal status: INITIAL   PLAN: PT FREQUENCY: 1-2x/week  PT DURATION: 8 weeks  PLANNED INTERVENTIONS: 97164- PT Re-evaluation, 97750- Physical Performance Testing, 97110-Therapeutic exercises, 97530- Therapeutic activity, 97112- Neuromuscular re-education, 97535- Self Care, [id number]- Manual therapy, [phone]- Gait training, Patient/Family education, Balance training, Stair training, Joint mobilization, Joint manipulation, Spinal manipulation, Spinal mobilization, Cryotherapy, and Moist heat  PLAN FOR NEXT SESSION: Review HEP and progress PRN, manual/mobs and stretching for right hip, right hip and LE strengthening progressing to closed chain, gait and stair training, balance training   Elaine Daring, PT, DPT, LAT, ATC 10/09/23  8:01 AM Phone: [phone] Fax: [phone]

## 2023-10-17 ENCOUNTER — Other Ambulatory Visit: Payer: Self-pay

## 2023-10-17 ENCOUNTER — Encounter: Payer: Self-pay | Admitting: Physical Therapy

## 2023-10-17 ENCOUNTER — Ambulatory Visit: Admitting: Physical Therapy

## 2023-10-17 DIAGNOSIS — M25551 Pain in right hip: Secondary | ICD-10-CM

## 2023-10-17 DIAGNOSIS — M6281 Muscle weakness (generalized): Secondary | ICD-10-CM | POA: Diagnosis not present

## 2023-10-17 NOTE — Therapy (Signed)
 OUTPATIENT PHYSICAL THERAPY TREATMENT   Patient Name: Shelley Berry MRN: [id number] DOB:Jul 10, 1955, 68 y.o., female Today's Date: 10/17/2023   END OF SESSION:  PT End of Session - 10/17/23 1022     Visit Number 2    Number of Visits 9    Date for PT Re-Evaluation 12/03/23    Authorization Type [employer]    PT Start Time 1020    PT Stop Time 1100    PT Time Calculation (min) 40 min    Activity Tolerance Patient tolerated treatment well    Behavior During Therapy [facility] for tasks assessed/performed           Past Medical History:  Diagnosis Date   Genital herpes    Ulcerative colitis onset 1998   sees Dr. Ulysees Sous   Past Surgical History:  Procedure Laterality Date   ABDOMINAL HYSTERECTOMY  2000   TAH, ovaries intact    COLONOSCOPY  June 2012   per Dr. Sous, normal, repeat in 2 yrs    PRE-MALIGNANT / BENIGN SKIN LESION EXCISION     several on the back and right thigh    Patient Active Problem List   Diagnosis Date Noted   Ulcerative colitis (HCC) 12/24/2010    PCP: Royden Ronal Czar, FNP  REFERRING PROVIDER: Joane Artist RAMAN, MD  REFERRING DIAG: Right hip pain  THERAPY DIAG:  Pain in right hip  Muscle weakness (generalized)  Rationale for Evaluation and Treatment: Rehabilitation  ONSET DATE: Chronic, ongoing about a year   SUBJECTIVE:  SUBJECTIVE STATEMENT: Patient reports she is doing ok, she did the exercises. States no difference. She reports like sometimes she feels like her knee is the issue.   Eval: Patient reports she started walking weird maybe a year ago and she also feels like she doesn't have full range of motion on the right side when putting her socks on. She also walks up stairs weird. She works from home and goes up down the stairs and she has to go down stairs one at a time. She also notes pain in the right hip when she sleeps on that side. When she gets up out of chair she has to stand for a bit before she starts walking because she gets stiff.  She has been trying to walk more and that didn't help too much, and it does feel like her knees are being affected by her hip because she is walking weird.   PERTINENT HISTORY: See PMH above  PAIN:  Are you having pain? Yes:  NPRS scale: 3/10 Pain location: Right hip Pain description: Stiff Aggravating factors: Walking, stairs, putting on socks Relieving factors: Stretching  PRECAUTIONS: None  PATIENT GOALS: Improve walking and going up/down stairs   OBJECTIVE:  Note: Objective measures were completed at Evaluation unless otherwise noted. PATIENT SURVEYS:  PSFS: 4.25 Putting on shoes/socks: 4 Bending completely to wash feet/shave: 3 Walking smoothly: 4 Going up and down stairs: 6  MUSCLE LENGTH: Slight limitation with bilateral hamstring, right hip flexor and quad and piriformis flexibility  POSTURE:   Grossly WFL  PALPATION: Mildly tender to lateral aspect of right hip  Trial LAD on right and patient reports pulling in right lower back region  LOWER EXTREMITY ROM: Right hip PROM IR and extension slightly limited compared to left  LOWER EXTREMITY MMT:  MMT Right eval Left eval  Hip flexion 4- 4  Hip extension 3 4-  Hip abduction 4- 4  Hip adduction    Hip internal rotation  Hip external rotation    Knee flexion 4+ 5  Knee extension 5 5  Ankle dorsiflexion    Ankle plantarflexion    Ankle inversion    Ankle eversion     (Blank rows = not tested)  FUNCTIONAL TESTS:  5 times sit to stand: 11 seconds  GAIT: Assistive device utilized: None Level of assistance: Complete Independence Comments: Antalgic on right, reduced hip extension on right                                                                                                                               TREATMENT [facility] Adult PT Treatment:                                                DATE: 10/17/2023 Recumbent bike L4 x 5 min to improve endurance and workload capacity LAD with belt x 3  bouts Hooklying lateral hip mobs with belt x 3 bouts Posterior hip mobs with hip at 90 deg flexion x 3 bouts Right hip PROM Supine hamstring stretch Modified thomas stretch Trial supine ITB stretch but had pain in right groin Bridge 2 x 10 SLR 2 x 10 Side clamshell with green 2 x 10 on right  PATIENT EDUCATION:  Education details: HEP Person educated: Patient Education method: Programmer, multimedia, Demonstration, Actor cues, Verbal cues Education comprehension: verbalized understanding, returned demonstration, verbal cues required, tactile cues required, and needs further education  HOME EXERCISE PROGRAM: Access Code: [email or web address]    ASSESSMENT: CLINICAL IMPRESSION: Patient tolerated therapy well with no adverse effects. Therapy focused on improving her right hip mobility and progressing hip strengthening with good tolerance. She does report some lateral knee pain that could be related more to ITB. No changes made to her HEP this visit. Patient would benefit from continued skilled PT to progress mobility and strength in order to reduce pain and maximize functional ability.   Eval: Patient is a 68 y.o. female who was seen today for physical therapy evaluation and treatment for chronic right hip pain. Currently she exhibits slight limitations in hip motion, gross flexibility deficits of the right hip, gross strength deficits of the right hip, gait deviations that are likely contributing to her pain and impacting her functional ability.   OBJECTIVE IMPAIRMENTS: Abnormal gait, decreased activity tolerance, decreased balance, decreased ROM, decreased strength, impaired flexibility, and pain.   ACTIVITY LIMITATIONS: carrying, lifting, sitting, standing, squatting, sleeping, stairs, dressing, and locomotion level  PARTICIPATION LIMITATIONS: meal prep, cleaning, shopping, and community activity  PERSONAL FACTORS: Fitness, Past/current experiences, and Time since onset of injury/illness/exacerbation  are also affecting patient's functional outcome.    GOALS: Goals reviewed with patient? Yes  SHORT TERM GOALS: Target date: 11/05/2023  Patient will be I with initial HEP in order to progress with therapy. Baseline: HEP provided at eval  Goal status: INITIAL  2.  Patient will report right hip pain </= 1/10 in order to reduce functional limitations Baseline: 3/10 pain Goal status: INITIAL  LONG TERM GOALS: Target date: 12/03/2023  Patient will be I with final HEP to maintain progress from PT. Baseline: HEP provided at eval Goal status: INITIAL  2.  Patient will report PSFS >/= 7 in order to indicate improvement in their functional ability. Baseline: 4.25 Goal status: INITIAL  3.  Patient will demonstrate right hip strength grossly>/= 4/5 MMT in order to improve walking tolerance and stair negotiation Baseline: see limitations above Goal status: INITIAL  4.  Patient will demonstrate right hip PROM grossly equal to left without pain to improve ability to put on socks/shoes Baseline: see limitations above Goal status: INITIAL   PLAN: PT FREQUENCY: 1-2x/week  PT DURATION: 8 weeks  PLANNED INTERVENTIONS: 97164- PT Re-evaluation, 97750- Physical Performance Testing, 97110-Therapeutic exercises, 97530- Therapeutic activity, 97112- Neuromuscular re-education, 97535- Self Care, 02859- Manual therapy, [phone]- Gait training, Patient/Family education, Balance training, Stair training, Joint mobilization, Joint manipulation, Spinal manipulation, Spinal mobilization, Cryotherapy, and Moist heat  PLAN FOR NEXT SESSION: Review HEP and progress PRN, manual/mobs and stretching for right hip, right hip and LE strengthening progressing to closed chain, gait and stair training, balance training   Elaine Daring, PT, DPT, LAT, ATC 10/17/23  11:28 AM Phone: [phone] Fax: [phone]

## 2023-10-22 NOTE — Therapy (Incomplete)
 OUTPATIENT PHYSICAL THERAPY TREATMENT   Patient Name: Shelley Berry MRN: [id number] DOB:01-31-1956, 68 y.o., female Today's Date: 10/22/2023   END OF SESSION:     Past Medical History:  Diagnosis Date   Genital herpes    Ulcerative colitis onset 1998   sees Dr. Ulysees Sous   Past Surgical History:  Procedure Laterality Date   ABDOMINAL HYSTERECTOMY  2000   TAH, ovaries intact    COLONOSCOPY  June 2012   per Dr. Sous, normal, repeat in 2 yrs    PRE-MALIGNANT / BENIGN SKIN LESION EXCISION     several on the back and right thigh    Patient Active Problem List   Diagnosis Date Noted   Ulcerative colitis (HCC) 12/24/2010    PCP: Royden Ronal Czar, FNP  REFERRING PROVIDER: Joane Artist RAMAN, MD  REFERRING DIAG: Right hip pain  THERAPY DIAG:  No diagnosis found.  Rationale for Evaluation and Treatment: Rehabilitation  ONSET DATE: Chronic, ongoing about a year   SUBJECTIVE:  SUBJECTIVE STATEMENT: Patient reports she is doing ok, she did the exercises. States no difference. She reports like sometimes she feels like her knee is the issue.   Eval: Patient reports she started walking weird maybe a year ago and she also feels like she doesn't have full range of motion on the right side when putting her socks on. She also walks up stairs weird. She works from home and goes up down the stairs and she has to go down stairs one at a time. She also notes pain in the right hip when she sleeps on that side. When she gets up out of chair she has to stand for a bit before she starts walking because she gets stiff. She has been trying to walk more and that didn't help too much, and it does feel like her knees are being affected by her hip because she is walking weird.   PERTINENT HISTORY: See PMH above  PAIN:  Are you having pain? Yes:  NPRS scale: 3/10 Pain location: Right hip Pain description: Stiff Aggravating factors: Walking, stairs, putting on socks Relieving factors:  Stretching  PRECAUTIONS: None  PATIENT GOALS: Improve walking and going up/down stairs   OBJECTIVE:  Note: Objective measures were completed at Evaluation unless otherwise noted. PATIENT SURVEYS:  PSFS: 4.25 Putting on shoes/socks: 4 Bending completely to wash feet/shave: 3 Walking smoothly: 4 Going up and down stairs: 6  MUSCLE LENGTH: Slight limitation with bilateral hamstring, right hip flexor and quad and piriformis flexibility  POSTURE:   Grossly WFL  PALPATION: Mildly tender to lateral aspect of right hip  Trial LAD on right and patient reports pulling in right lower back region  LOWER EXTREMITY ROM: Right hip PROM IR and extension slightly limited compared to left  LOWER EXTREMITY MMT:  MMT Right eval Left eval  Hip flexion 4- 4  Hip extension 3 4-  Hip abduction 4- 4  Hip adduction    Hip internal rotation    Hip external rotation    Knee flexion 4+ 5  Knee extension 5 5  Ankle dorsiflexion    Ankle plantarflexion    Ankle inversion    Ankle eversion     (Blank rows = not tested)  FUNCTIONAL TESTS:  5 times sit to stand: 11 seconds  GAIT: Assistive device utilized: None Level of assistance: Complete Independence Comments: Antalgic on right, reduced hip extension on right  TREATMENT [facility] Adult PT Treatment:                                                DATE: 10/23/2023 Recumbent bike L4 x 5 min to improve endurance and workload capacity LAD with belt x 3 bouts Hooklying lateral hip mobs with belt x 3 bouts Posterior hip mobs with hip at 90 deg flexion x 3 bouts Right hip PROM Supine hamstring stretch Modified thomas stretch Trial supine ITB stretch but had pain in right groin Bridge 2 x 10 SLR 2 x 10 Side clamshell with green 2 x 10 on right  PATIENT EDUCATION:  Education details: HEP Person educated:  Patient Education method: Programmer, multimedia, Demonstration, Actor cues, Verbal cues Education comprehension: verbalized understanding, returned demonstration, verbal cues required, tactile cues required, and needs further education  HOME EXERCISE PROGRAM: Access Code: [email or web address]    ASSESSMENT: CLINICAL IMPRESSION: Patient tolerated therapy well with no adverse effects. *** Patient would benefit from continued skilled PT to progress mobility and strength in order to reduce pain and maximize functional ability.  Therapy focused on improving her right hip mobility and progressing hip strengthening with good tolerance. She does report some lateral knee pain that could be related more to ITB. No changes made to her HEP this visit.    Eval: Patient is a 68 y.o. female who was seen today for physical therapy evaluation and treatment for chronic right hip pain. Currently she exhibits slight limitations in hip motion, gross flexibility deficits of the right hip, gross strength deficits of the right hip, gait deviations that are likely contributing to her pain and impacting her functional ability.   OBJECTIVE IMPAIRMENTS: Abnormal gait, decreased activity tolerance, decreased balance, decreased ROM, decreased strength, impaired flexibility, and pain.   ACTIVITY LIMITATIONS: carrying, lifting, sitting, standing, squatting, sleeping, stairs, dressing, and locomotion level  PARTICIPATION LIMITATIONS: meal prep, cleaning, shopping, and community activity  PERSONAL FACTORS: Fitness, Past/current experiences, and Time since onset of injury/illness/exacerbation are also affecting patient's functional outcome.    GOALS: Goals reviewed with patient? Yes  SHORT TERM GOALS: Target date: 11/05/2023  Patient will be I with initial HEP in order to progress with therapy. Baseline: HEP provided at eval Goal status: INITIAL  2.  Patient will report right hip pain </= 1/10 in order to reduce functional  limitations Baseline: 3/10 pain Goal status: INITIAL  LONG TERM GOALS: Target date: 12/03/2023  Patient will be I with final HEP to maintain progress from PT. Baseline: HEP provided at eval Goal status: INITIAL  2.  Patient will report PSFS >/= 7 in order to indicate improvement in their functional ability. Baseline: 4.25 Goal status: INITIAL  3.  Patient will demonstrate right hip strength grossly>/= 4/5 MMT in order to improve walking tolerance and stair negotiation Baseline: see limitations above Goal status: INITIAL  4.  Patient will demonstrate right hip PROM grossly equal to left without pain to improve ability to put on socks/shoes Baseline: see limitations above Goal status: INITIAL   PLAN: PT FREQUENCY: 1-2x/week  PT DURATION: 8 weeks  PLANNED INTERVENTIONS: 97164- PT Re-evaluation, 97750- Physical Performance Testing, 97110-Therapeutic exercises, 97530- Therapeutic activity, 97112- Neuromuscular re-education, 97535- Self Care, 02859- Manual therapy, [phone]- Gait training, Patient/Family education, Balance training, Stair training, Joint mobilization, Joint manipulation, Spinal manipulation, Spinal mobilization, Cryotherapy, and Moist heat  PLAN FOR NEXT SESSION: Review  HEP and progress PRN, manual/mobs and stretching for right hip, right hip and LE strengthening progressing to closed chain, gait and stair training, balance training   Elaine Daring, PT, DPT, LAT, ATC 10/22/23  8:27 AM Phone: [phone] Fax: [phone]

## 2023-10-23 ENCOUNTER — Encounter: Admitting: Physical Therapy

## 2023-10-30 ENCOUNTER — Encounter: Admitting: Physical Therapy

## 2023-11-01 ENCOUNTER — Emergency Department (HOSPITAL_BASED_OUTPATIENT_CLINIC_OR_DEPARTMENT_OTHER)
Admission: EM | Admit: 2023-11-01 | Discharge: 2023-11-01 | Disposition: A | Discharge status: Home / Self Care | Attending: Emergency Medicine | Admitting: Emergency Medicine

## 2023-11-01 ENCOUNTER — Encounter (HOSPITAL_BASED_OUTPATIENT_CLINIC_OR_DEPARTMENT_OTHER): Payer: Self-pay | Admitting: Emergency Medicine

## 2023-11-01 ENCOUNTER — Emergency Department (HOSPITAL_BASED_OUTPATIENT_CLINIC_OR_DEPARTMENT_OTHER)

## 2023-11-01 DIAGNOSIS — R109 Unspecified abdominal pain: Secondary | ICD-10-CM

## 2023-11-01 DIAGNOSIS — E876 Hypokalemia: Secondary | ICD-10-CM | POA: Diagnosis not present

## 2023-11-01 DIAGNOSIS — K51811 Other ulcerative colitis with rectal bleeding: Secondary | ICD-10-CM | POA: Insufficient documentation

## 2023-11-01 DIAGNOSIS — Z87891 Personal history of nicotine dependence: Secondary | ICD-10-CM | POA: Diagnosis not present

## 2023-11-01 DIAGNOSIS — R1032 Left lower quadrant pain: Secondary | ICD-10-CM | POA: Diagnosis present

## 2023-11-01 DIAGNOSIS — K5732 Diverticulitis of large intestine without perforation or abscess without bleeding: Secondary | ICD-10-CM | POA: Insufficient documentation

## 2023-11-01 DIAGNOSIS — K5790 Diverticulosis of intestine, part unspecified, without perforation or abscess without bleeding: Secondary | ICD-10-CM

## 2023-11-01 LAB — CBC
HCT: 42.1 % (ref 36.0–46.0)
Hemoglobin: 14.7 g/dL (ref 12.0–15.0)
MCH: 33.8 pg (ref 26.0–34.0)
MCHC: 34.9 g/dL (ref 30.0–36.0)
MCV: 96.8 fL (ref 80.0–100.0)
Platelets: 373 K/uL (ref 150–400)
RBC: 4.35 MIL/uL (ref 3.87–5.11)
RDW: 12.7 % (ref 11.5–15.5)
WBC: 9.8 K/uL (ref 4.0–10.5)
nRBC: 0 % (ref 0.0–0.2)

## 2023-11-01 LAB — URINALYSIS, ROUTINE W REFLEX MICROSCOPIC
Bilirubin Urine: NEGATIVE
Glucose, UA: NEGATIVE mg/dL
Ketones, ur: NEGATIVE mg/dL
Leukocytes,Ua: NEGATIVE
Nitrite: NEGATIVE
Protein, ur: NEGATIVE mg/dL
Specific Gravity, Urine: >1.046 — ABNORMAL HIGH (ref 1.005–1.030)
pH: 5.5 (ref 5.0–8.0)

## 2023-11-01 LAB — COMPREHENSIVE METABOLIC PANEL WITH GFR
ALT: 22 U/L (ref 0–44)
AST: 33 U/L (ref 15–41)
Albumin: 4.3 g/dL (ref 3.5–5.0)
Alkaline Phosphatase: 86 U/L (ref 38–126)
Anion gap: 18 — ABNORMAL HIGH (ref 5–15)
BUN: 6 mg/dL — ABNORMAL LOW (ref 8–23)
CO2: 25 mmol/L (ref 22–32)
Calcium: 9.6 mg/dL (ref 8.9–10.3)
Chloride: 93 mmol/L — ABNORMAL LOW (ref 98–111)
Creatinine, Ser: 0.7 mg/dL (ref 0.44–1.00)
GFR, Estimated: >60 mL/min (ref >60)
Glucose, Bld: 106 mg/dL — ABNORMAL HIGH (ref 70–99)
Potassium: 3.2 mmol/L — ABNORMAL LOW (ref 3.5–5.1)
Sodium: 136 mmol/L (ref 135–145)
Total Bilirubin: 0.6 mg/dL (ref 0.0–1.2)
Total Protein: 7.7 g/dL (ref 6.5–8.1)

## 2023-11-01 LAB — LIPASE, BLOOD: Lipase: 22 U/L (ref 11–51)

## 2023-11-01 MED ORDER — POTASSIUM CHLORIDE CRYS ER 20 MEQ PO TBCR: 20.0000 meq | EXTENDED_RELEASE_TABLET | Freq: Every day | ORAL | 0 refills | Status: AC

## 2023-11-01 MED ORDER — SUCRALFATE 1 G PO TABS: 1.0000 g | ORAL_TABLET | Freq: Three times a day (TID) | ORAL | 0 refills | Status: AC

## 2023-11-01 MED ORDER — PREDNISONE 20 MG PO TABS: 40.0000 mg | ORAL_TABLET | Freq: Every day | ORAL | 0 refills | Status: AC

## 2023-11-01 MED ORDER — ONDANSETRON HCL 4 MG PO TABS
4.0000 mg | ORAL_TABLET | ORAL | 0 refills | Status: AC | PRN
Start: 2023-11-01 — End: ?

## 2023-11-01 MED ORDER — ONDANSETRON HCL 4 MG/2ML IJ SOLN
4.0000 mg | Freq: Once | INTRAMUSCULAR | Status: AC
  Administered 2023-11-01: 4 mg via INTRAVENOUS
  Filled 2023-11-01: qty 2

## 2023-11-01 MED ORDER — SODIUM CHLORIDE 0.9 % IV BOLUS
1000.0000 mL | Freq: Once | INTRAVENOUS | Status: AC
  Administered 2023-11-01: 1000 mL via INTRAVENOUS

## 2023-11-01 MED ORDER — MORPHINE SULFATE (PF) 4 MG/ML IV SOLN
4.0000 mg | Freq: Once | INTRAVENOUS | Status: AC
  Administered 2023-11-01: 4 mg via INTRAVENOUS
  Filled 2023-11-01: qty 1

## 2023-11-01 MED ORDER — HYDROCODONE-ACETAMINOPHEN 5-325 MG PO TABS: 1.0000 | ORAL_TABLET | ORAL | 0 refills | Status: AC | PRN

## 2023-11-01 MED ORDER — PREDNISONE 20 MG PO TABS
40.0000 mg | ORAL_TABLET | Freq: Once | ORAL | Status: AC
  Administered 2023-11-01: 40 mg via ORAL
  Filled 2023-11-01: qty 2

## 2023-11-01 MED ORDER — IOHEXOL 300 MG/ML  SOLN
100.0000 mL | Freq: Once | INTRAMUSCULAR | Status: AC | PRN
  Administered 2023-11-01: 100 mL via INTRAVENOUS

## 2023-11-01 NOTE — ED Notes (Signed)
 Attempt to collect UA was unsuccessful. Pt is aware that a sample is needed.

## 2023-11-01 NOTE — ED Triage Notes (Signed)
 Lower abdo pain Noticed some blood at rectum Started at 5 AM Hx colitis  Severe cramping

## 2023-11-01 NOTE — ED Provider Notes (Signed)
 [place] [facility]ERGENCY DEPARTMENT AT [facility] Provider Note  CSN: [id number] Arrival date & time: 11/01/23 1217  Chief Complaint(s) Abdominal Pain  HPI Shelley Berry is a 68 y.o. female with past medical history as below, significant for ulcerative colitis Dr. Kristie who presents to the ED with complaint of abdominal pain, hematochezia  Patient reports suprapubic and left lower quad abdominal pain over the past 24 hours, noted some drops of blood in her stool, nausea and emesis x 1.  No fevers or chills.  No significant change to urination.  No rashes.  She is compliant with her mesalamine.  She is  Past Medical History Past Medical History:  Diagnosis Date   Genital herpes    Ulcerative colitis onset 58   sees Dr. Ulysees Kristie   Patient Active Problem List   Diagnosis Date Noted   Ulcerative colitis (HCC) 12/24/2010   Home Medication(s) Prior to Admission medications   Medication Sig Start Date End Date Taking? Authorizing Provider  atorvastatin (LIPITOR) 40 MG tablet Atorvastatin Calcium    [provider]  furosemide (LASIX) 20 MG tablet Take 20 mg by mouth daily. 07/01/18   [provider]  LORazepam  (ATIVAN ) 0.5 MG tablet Take 0.5 mg by mouth every 8 (eight) hours.    [provider]  Mesalamine 800 MG TBEC Take 1,600 mg by mouth. 02/11/21   [provider]  sertraline (ZOLOFT) 25 MG tablet Sertraline HCl    [provider]  valACYclovir  (VALTREX ) 1000 MG tablet Take 1,000 mg by mouth daily. 05/01/18   [provider]  VITAMIN D PO Take 1 tablet by mouth daily.    [provider]                                                                                                                                    Past Surgical History Past Surgical History:  Procedure Laterality Date   ABDOMINAL HYSTERECTOMY  2000   TAH, ovaries intact    COLONOSCOPY  June 2012   per Dr. Kristie, normal, repeat in 2 yrs     PRE-MALIGNANT / BENIGN SKIN LESION EXCISION     several on the back and right thigh    Family History Family History  Problem Relation Age of Onset   Colon cancer Mother    Colon polyps Brother    Colon cancer Maternal Grandmother     Social History Social History   Tobacco Use   Smoking status: Former    Types: Cigarettes   Smokeless tobacco: Never  Substance Use Topics   Alcohol use: Yes    Alcohol/week: 3.0 standard drinks of alcohol    Types: 3 Standard drinks or equivalent per week   Drug use: No   Allergies Nsaids  Review of Systems A thorough review of systems was obtained and all systems are negative except as noted in the HPI and PMH.  Physical Exam Vital Signs  I have reviewed the triage vital signs BP 119/82   Pulse 84   Temp 98.6 F (37 C) (Oral)   Resp 16   SpO2 94%  Physical Exam  ED Results and Treatments Labs (all labs ordered are listed, but only abnormal results are displayed) Labs Reviewed  COMPREHENSIVE METABOLIC PANEL WITH GFR - Abnormal; Notable for the following components:      Result Value   Potassium 3.2 (*)    Chloride 93 (*)    Glucose, Bld 106 (*)    BUN 6 (*)    Anion gap 18 (*)    All other components within normal limits  LIPASE, BLOOD  CBC  URINALYSIS, ROUTINE W REFLEX MICROSCOPIC                                                                                                                          Radiology CT ABDOMEN PELVIS W CONTRAST Result Date: 11/01/2023 EXAM: CT ABDOMEN AND PELVIS WITH CONTRAST 11/01/2023 02:19:16 PM TECHNIQUE: CT of the abdomen and pelvis was performed with the administration of intravenous contrast. Multiplanar reformatted images are provided for review. Automated exposure control, iterative reconstruction, and/or weight-based adjustment of the mA/kV was utilized to reduce the radiation dose to as low as reasonably achievable. COMPARISON: CT of the abdomen and pelvis without contrast 12/05/2014.  CLINICAL HISTORY: Abdominal pain, acute, nonlocalized; lower abd pain, rectal bleeding, hx colitis. FINDINGS: LOWER CHEST: Visualized lung bases are clear. LIVER: Normal size and contour. GALLBLADDER AND BILE DUCTS: No wall thickening. No cholelithiasis. No biliary ductal dilatation. SPLEEN: Normal size. No focal lesion. PANCREAS: No mass. No ductal dilatation. ADRENAL GLANDS: Normal appearance. No mass. KIDNEYS, URETERS AND BLADDER: No stones in the kidneys or ureters. No hydronephrosis. No perinephric or periureteral stranding. Urinary bladder is unremarkable. GI AND BOWEL: Minimal diverticular changes are present in the transverse and sigmoid colon. No focal inflammation is present to suggest diverticulitis. Stomach demonstrates no acute abnormality. There is no bowel obstruction. No bowel wall thickening. PERITONEUM AND RETROPERITONEUM: No ascites. No free air. VASCULATURE: Aorta is normal in caliber. LYMPH NODES: No lymphadenopathy. REPRODUCTIVE ORGANS: Hysterectomy as noted. BONES AND SOFT TISSUES: Grade 1 degenerative retrolisthesis is present at L3-4. Asymmetric right-sided endplate sclerotic changes are present at L3-4. Mild leftward curvature is present at L3-4. Rightward curvature is present at L4-5. Moderate degenerative changes are present in the hips, right greater than left. Significant loss of joint space is present on the right. No acute osseous abnormality. No focal soft tissue abnormality. IMPRESSION: 1. Minimal diverticular changes in the transverse and sigmoid colon without evidence of diverticulitis. 2. Moderate degenerative changes in the hips, right greater than left, with significant loss of joint space on the right. Electronically signed by: Lonni Necessary MD 11/01/2023 03:05 PM EDT RP Workstation: [id number]    Pertinent labs & imaging results that were available during my care of the patient were reviewed by me and considered in my  medical decision making (see MDM for  details).  Medications Ordered in ED Medications  morphine  (PF) 4 MG/ML injection 4 mg (4 mg Intravenous Given 11/01/23 1404)  ondansetron  (ZOFRAN ) injection 4 mg (4 mg Intravenous Given 11/01/23 1403)  sodium chloride  0.9 % bolus 1,000 mL (1,000 mLs Intravenous New Bag/Given 11/01/23 1403)  iohexol  (OMNIPAQUE ) 300 MG/ML solution 100 mL (100 mLs Intravenous Contrast Given 11/01/23 1414)                                                                                                                                     Procedures Procedures  (including critical care time)  Medical Decision Making / ED Course    Medical Decision Making:    Kalanie Fewell Rood is a 68 y.o. female ***. The complaint involves an extensive differential diagnosis and also carries with it a high risk of complications and morbidity.  Serious etiology was considered. Ddx includes but is not limited to: ***  Complete initial physical exam performed, notably the patient was in ***.    Reviewed and confirmed nursing documentation for past medical history, family history, social history.  Vital signs reviewed.    ***       ***               Additional history obtained: -Additional history obtained from {wsadditionalhistorian:28072} -External records from outside source obtained and reviewed including: Chart review including previous notes, labs, imaging, consultation notes including  ***   Lab Tests: -I ordered, reviewed, and interpreted labs.   The pertinent results include:   Labs Reviewed  COMPREHENSIVE METABOLIC PANEL WITH GFR - Abnormal; Notable for the following components:      Result Value   Potassium 3.2 (*)    Chloride 93 (*)    Glucose, Bld 106 (*)    BUN 6 (*)    Anion gap 18 (*)    All other components within normal limits  LIPASE, BLOOD  CBC  URINALYSIS, ROUTINE W REFLEX MICROSCOPIC    Notable for ***  EKG   EKG Interpretation Date/Time:    Ventricular Rate:    PR  Interval:    QRS Duration:    QT Interval:    QTC Calculation:   R Axis:      Text Interpretation:           Imaging Studies ordered: I ordered imaging studies including *** I independently visualized the following imaging with scope of interpretation limited to determining acute life threatening conditions related to emergency care; findings noted above I agree with the radiologist interpretation If any imaging was obtained with contrast I closely monitored patient for any possible adverse reaction a/w contrast administration in the emergency department   Medicines ordered and prescription drug management: Meds ordered this encounter  Medications   morphine  (PF) 4 MG/ML injection 4 mg   ondansetron  (ZOFRAN ) injection 4 mg   sodium chloride  0.9 %  bolus 1,000 mL   iohexol  (OMNIPAQUE ) 300 MG/ML solution 100 mL    -I have reviewed the patients home medicines and have made adjustments as needed   Consultations Obtained: I requested consultation with the ***,  and discussed lab and imaging findings as well as pertinent plan - they recommend: ***   Cardiac Monitoring: The patient was maintained on a cardiac monitor.  I personally viewed and interpreted the cardiac monitored which showed an underlying rhythm of: *** Continuous pulse oximetry interpreted by myself, ***% on ***.    Social Determinants of Health:  Diagnosis or treatment significantly limited by social determinants of health: {wssoc:28071}   Reevaluation: After the interventions noted above, I reevaluated the patient and found that they have {resolved/improved/worsened:23923::improved}  Co morbidities that complicate the patient evaluation  Past Medical History:  Diagnosis Date   Genital herpes    Ulcerative colitis onset 56   sees Dr. Ulysees Sous      Dispostion: Disposition decision including need for hospitalization was considered, and patient {wsdispo:28070::discharged from emergency  department.}    Final Clinical Impression(s) / ED Diagnoses Final diagnoses:  None

## 2023-11-01 NOTE — ED Notes (Signed)
 Patient transported to CT

## 2023-11-01 NOTE — ED Notes (Signed)
 Pt is aware urine is needed to be collected for sample.

## 2023-11-01 NOTE — Discharge Instructions (Addendum)
 It was a pleasure caring for you today in the emergency department.  Please call gastroenterology office on Tuesday to arrange follow-up.  In the meantime please follow a bland diet.  Avoid spicy or fried foods.  Avoid alcohol or NSAIDs.  Your potassium is mildly reduced, I have sent some potassium pills to your pharmacy.  Once you are able to eat and drink normally this should normalize  Please return to the emergency department for any worsening or worrisome symptoms.

## 2023-11-13 NOTE — Therapy (Unsigned)
 OUTPATIENT PHYSICAL THERAPY TREATMENT   Patient Name: Shelley Berry MRN: [id number] DOB:01/25/1956, 68 y.o., female Today's Date: 11/14/2023   END OF SESSION:  PT End of Session - 11/14/23 1039     Visit Number 3    Number of Visits 9    Date for PT Re-Evaluation 12/03/23    Authorization Type [employer]    PT Start Time 1030    PT Stop Time 1110    PT Time Calculation (min) 40 min    Activity Tolerance Patient tolerated treatment well    Behavior During Therapy [facility] for tasks assessed/performed            Past Medical History:  Diagnosis Date   Genital herpes    Ulcerative colitis onset 1998   sees Dr. Ulysees Sous   Past Surgical History:  Procedure Laterality Date   ABDOMINAL HYSTERECTOMY  2000   TAH, ovaries intact    COLONOSCOPY  June 2012   per Dr. Sous, normal, repeat in 2 yrs    PRE-MALIGNANT / BENIGN SKIN LESION EXCISION     several on the back and right thigh    Patient Active Problem List   Diagnosis Date Noted   Ulcerative colitis (HCC) 12/24/2010    PCP: Royden Ronal Czar, FNP  REFERRING PROVIDER: Joane Artist RAMAN, MD  REFERRING DIAG: Right hip pain  THERAPY DIAG:  Pain in right hip  Muscle weakness (generalized)  Rationale for Evaluation and Treatment: Rehabilitation  ONSET DATE: Chronic, ongoing about a year   SUBJECTIVE:  SUBJECTIVE STATEMENT: Patient reports she feels like she is walking better. States she doesn't ongoing pain, can get a catch in hip going up steps every once in a while.   Eval: Patient reports she started walking weird maybe a year ago and she also feels like she doesn't have full range of motion on the right side when putting her socks on. She also walks up stairs weird. She works from home and goes up down the stairs and she has to go down stairs one at a time. She also notes pain in the right hip when she sleeps on that side. When she gets up out of chair she has to stand for a bit before she starts walking because she gets  stiff. She has been trying to walk more and that didn't help too much, and it does feel like her knees are being affected by her hip because she is walking weird.   PERTINENT HISTORY: See PMH above  PAIN:  Are you having pain? Yes:  NPRS scale: 1-2/10 Pain location: Right hip Pain description: Stiff Aggravating factors: Walking, stairs, putting on socks Relieving factors: Stretching  PRECAUTIONS: None  PATIENT GOALS: Improve walking and going up/down stairs   OBJECTIVE:  Note: Objective measures were completed at Evaluation unless otherwise noted. PATIENT SURVEYS:  PSFS: 4.25 Putting on shoes/socks: 4 Bending completely to wash feet/shave: 3 Walking smoothly: 4 Going up and down stairs: 6  MUSCLE LENGTH: Slight limitation with bilateral hamstring, right hip flexor and quad and piriformis flexibility  POSTURE:   Grossly WFL  PALPATION: Mildly tender to lateral aspect of right hip  Trial LAD on right and patient reports pulling in right lower back region  LOWER EXTREMITY ROM: Right hip PROM IR and extension slightly limited compared to left  LOWER EXTREMITY MMT:  MMT Right eval Left eval  Hip flexion 4- 4  Hip extension 3 4-  Hip abduction 4- 4  Hip adduction  Hip internal rotation    Hip external rotation    Knee flexion 4+ 5  Knee extension 5 5  Ankle dorsiflexion    Ankle plantarflexion    Ankle inversion    Ankle eversion     (Blank rows = not tested)  FUNCTIONAL TESTS:  5 times sit to stand: 11 seconds  GAIT: Assistive device utilized: None Level of assistance: Complete Independence Comments: Antalgic on right, reduced hip extension on right                                                                                                                               TREATMENT [facility] Adult PT Treatment:                                                DATE: 11/14/2023 Recumbent bike L4 x 4 min to improve endurance and workload capacity IASTM using  theragun and FR for the right glutes, TFL, ITB and lateral quad and hamstring LAD with belt x 3 bouts Bridge with green 2 x 10 Side clamshell with green 2 x 10 right Goblet squat to table tap with 15# 3 x 10  PATIENT EDUCATION:  Education details: HEP update Person educated: Patient Education method: Explanation, Demonstration, Tactile cues, Verbal cues, Handout Education comprehension: verbalized understanding, returned demonstration, verbal cues required, tactile cues required, and needs further education  HOME EXERCISE PROGRAM: Access Code: [email or web address]    ASSESSMENT: CLINICAL IMPRESSION: Patient tolerated therapy well with no adverse effects. Therapy focused on improving her right hip mobility and progressing hip strengthening with good tolerance. She tolerated addition of STM for the right hip and ITB region and did report good therapeutic benefit with pain relief. Progressed with hip strengthening without any increase in right hip pain. Updated her HEP to progress strengthening for home, and encouraged patient in gym exercise and walking progression. Patient would benefit from continued skilled PT to progress mobility and strength in order to reduce pain and maximize functional ability.   Eval: Patient is a 68 y.o. female who was seen today for physical therapy evaluation and treatment for chronic right hip pain. Currently she exhibits slight limitations in hip motion, gross flexibility deficits of the right hip, gross strength deficits of the right hip, gait deviations that are likely contributing to her pain and impacting her functional ability.   OBJECTIVE IMPAIRMENTS: Abnormal gait, decreased activity tolerance, decreased balance, decreased ROM, decreased strength, impaired flexibility, and pain.   ACTIVITY LIMITATIONS: carrying, lifting, sitting, standing, squatting, sleeping, stairs, dressing, and locomotion level  PARTICIPATION LIMITATIONS: meal prep, cleaning, shopping, and  community activity  PERSONAL FACTORS: Fitness, Past/current experiences, and Time since onset of injury/illness/exacerbation are also affecting patient's functional outcome.    GOALS: Goals reviewed with patient? Yes  SHORT TERM GOALS: Target date: 11/05/2023  Patient will be I with initial HEP in order to progress with therapy. Baseline: HEP provided at eval 11/14/2023: independent Goal status: MET  2.  Patient will report right hip pain </= 1/10 in order to reduce functional limitations Baseline: 3/10 pain 11/14/2023: reports 1-2/10 Goal status: ONGOING  LONG TERM GOALS: Target date: 12/03/2023  Patient will be I with final HEP to maintain progress from PT. Baseline: HEP provided at eval Goal status: INITIAL  2.  Patient will report PSFS >/= 7 in order to indicate improvement in their functional ability. Baseline: 4.25 Goal status: INITIAL  3.  Patient will demonstrate right hip strength grossly>/= 4/5 MMT in order to improve walking tolerance and stair negotiation Baseline: see limitations above Goal status: INITIAL  4.  Patient will demonstrate right hip PROM grossly equal to left without pain to improve ability to put on socks/shoes Baseline: see limitations above Goal status: INITIAL   PLAN: PT FREQUENCY: 1-2x/week  PT DURATION: 8 weeks  PLANNED INTERVENTIONS: 97164- PT Re-evaluation, 97750- Physical Performance Testing, 97110-Therapeutic exercises, 97530- Therapeutic activity, 97112- Neuromuscular re-education, 97535- Self Care, [id number]- Manual therapy, [id number]- Gait training, Patient/Family education, Balance training, Stair training, Joint mobilization, Joint manipulation, Spinal manipulation, Spinal mobilization, Cryotherapy, and Moist heat  PLAN FOR NEXT SESSION: Review HEP and progress PRN, manual/mobs and stretching for right hip, right hip and LE strengthening progressing to closed chain, gait and stair training, balance training   Elaine Daring, PT, DPT, LAT,  ATC 11/14/23  11:22 AM Phone: [phone] Fax: [phone]

## 2023-11-14 ENCOUNTER — Encounter: Payer: Self-pay | Admitting: Physical Therapy

## 2023-11-14 ENCOUNTER — Ambulatory Visit: Admitting: Physical Therapy

## 2023-11-14 ENCOUNTER — Other Ambulatory Visit: Payer: Self-pay

## 2023-11-14 DIAGNOSIS — M6281 Muscle weakness (generalized): Secondary | ICD-10-CM

## 2023-11-14 DIAGNOSIS — M25551 Pain in right hip: Secondary | ICD-10-CM | POA: Diagnosis not present

## 2023-11-14 NOTE — Patient Instructions (Signed)
 Access Code: [email or web address] URL: [email or web address] Date: 11/14/2023 Prepared by: Elaine Daring  Exercises - Supine Piriformis Stretch with Foot on Ground  - 1 x daily - 3 reps - 30 seconds hold - Clam with Resistance  - 1 x daily - 2 sets - 10 reps - Bridge with Resistance  - 1 x daily - 2 sets - 10 reps - Active Straight Leg Raise with Quad Set  - 1 x daily - 2 sets - 10 reps - Squat with Chair Touch  - 1 x daily - 2 sets - 10 reps

## 2023-11-20 ENCOUNTER — Encounter: Admitting: Physical Therapy

## 2023-11-27 ENCOUNTER — Encounter: Payer: Self-pay | Admitting: Physical Therapy

## 2023-11-27 ENCOUNTER — Other Ambulatory Visit: Payer: Self-pay

## 2023-11-27 ENCOUNTER — Ambulatory Visit: Admitting: Physical Therapy

## 2023-11-27 DIAGNOSIS — M25551 Pain in right hip: Secondary | ICD-10-CM

## 2023-11-27 DIAGNOSIS — M6281 Muscle weakness (generalized): Secondary | ICD-10-CM

## 2023-11-27 NOTE — Therapy (Signed)
 OUTPATIENT PHYSICAL THERAPY TREATMENT   Patient Name: Shelley Berry MRN: [id number] DOB:1955/08/08, 68 y.o., female Today's Date: 11/27/2023   END OF SESSION:  PT End of Session - 11/27/23 1529     Visit Number 4    Number of Visits 9    Date for Recertification  12/03/23    Authorization Type [employer]    PT Start Time 1522    PT Stop Time 1600    PT Time Calculation (min) 38 min    Activity Tolerance Patient tolerated treatment well    Behavior During Therapy [facility] for tasks assessed/performed             Past Medical History:  Diagnosis Date   Genital herpes    Ulcerative colitis onset 1998   sees Dr. Ulysees Sous   Past Surgical History:  Procedure Laterality Date   ABDOMINAL HYSTERECTOMY  2000   TAH, ovaries intact    COLONOSCOPY  June 2012   per Dr. Sous, normal, repeat in 2 yrs    PRE-MALIGNANT / BENIGN SKIN LESION EXCISION     several on the back and right thigh    Patient Active Problem List   Diagnosis Date Noted   Ulcerative colitis (HCC) 12/24/2010    PCP: Royden Ronal Czar, FNP  REFERRING PROVIDER: Joane Artist RAMAN, MD  REFERRING DIAG: Right hip pain  THERAPY DIAG:  Pain in right hip  Muscle weakness (generalized)  Rationale for Evaluation and Treatment: Rehabilitation  ONSET DATE: Chronic, ongoing about a year   SUBJECTIVE:  SUBJECTIVE STATEMENT: Patient reports she thinks she slept weird last night because she woke up this morning and he lower back hurt. She reports that her hip was getting better but then last week her hip feels like it was bothering her more and she was walking differently. She reports that she is having most of her pain in the groin region now.  Eval: Patient reports she started walking weird maybe a year ago and she also feels like she doesn't have full range of motion on the right side when putting her socks on. She also walks up stairs weird. She works from home and goes up down the stairs and she has to go down stairs one  at a time. She also notes pain in the right hip when she sleeps on that side. When she gets up out of chair she has to stand for a bit before she starts walking because she gets stiff. She has been trying to walk more and that didn't help too much, and it does feel like her knees are being affected by her hip because she is walking weird.   PERTINENT HISTORY: See PMH above  PAIN:  Are you having pain? Yes:  NPRS scale: 2/10 Pain location: Right hip Pain description: Stiff Aggravating factors: Walking, stairs, putting on socks Relieving factors: Stretching  PRECAUTIONS: None  PATIENT GOALS: Improve walking and going up/down stairs   OBJECTIVE:  Note: Objective measures were completed at Evaluation unless otherwise noted. PATIENT SURVEYS:  PSFS: 4.25 Putting on shoes/socks: 4 Bending completely to wash feet/shave: 3 Walking smoothly: 4 Going up and down stairs: 6  MUSCLE LENGTH: Slight limitation with bilateral hamstring, right hip flexor and quad and piriformis flexibility  POSTURE:   Grossly WFL  PALPATION: Mildly tender to lateral aspect of right hip  Trial LAD on right and patient reports pulling in right lower back region  LOWER EXTREMITY ROM: Right hip PROM IR and extension slightly limited  compared to left  LOWER EXTREMITY MMT:  MMT Right eval Left eval  Hip flexion 4- 4  Hip extension 3 4-  Hip abduction 4- 4  Hip adduction    Hip internal rotation    Hip external rotation    Knee flexion 4+ 5  Knee extension 5 5  Ankle dorsiflexion    Ankle plantarflexion    Ankle inversion    Ankle eversion     (Blank rows = not tested)  FUNCTIONAL TESTS:  5 times sit to stand: 11 seconds  GAIT: Assistive device utilized: None Level of assistance: Complete Independence Comments: Antalgic on right, reduced hip extension on right                                                                                                                                TREATMENT [facility] Adult PT Treatment:                                                DATE: 11/27/2023 Recumbent bike L4 x 5 min to improve endurance and workload capacity LAD with belt x 3 bouts on right Posterior hip mobs with hip flexed to 90 deg x 3 bouts on right Modified thomas stretch 3 x 30 sec right Supine hamstring stretch 3 x 30 sec right LTR x 10 Bridge with adductor ball squeeze 2 x 10 Squat to table tap 2 x 10 Standing hip extension with green at knees x 15  PATIENT EDUCATION:  Education details: HEP Person educated: Patient Education method: Programmer, multimedia, Facilities manager, Actor cues, Verbal cues Education comprehension: verbalized understanding, returned demonstration, verbal cues required, tactile cues required, and needs further education  HOME EXERCISE PROGRAM: Access Code: [email or web address]    ASSESSMENT: CLINICAL IMPRESSION: Patient tolerated therapy well with no adverse effects. Therapy focused on improving right hip mobility and progressing her hip strengthening. She was reporting more groin related pain this visit which does seem consistent with right hip OA. She tolerated hip joint mobs well. She did report feeling the groin pain with her sit to stands but was cueing to stand all the way up tall and engage her glutes which did alleviate the pain with exercise. No changes made to HEP this visit. Patient would benefit from continued skilled PT to progress mobility and strength in order to reduce pain and maximize functional ability.   Eval: Patient is a 68 y.o. female who was seen today for physical therapy evaluation and treatment for chronic right hip pain. Currently she exhibits slight limitations in hip motion, gross flexibility deficits of the right hip, gross strength deficits of the right hip, gait deviations that are likely contributing to her pain and impacting her functional ability.   OBJECTIVE IMPAIRMENTS: Abnormal gait, decreased activity tolerance, decreased  balance, decreased ROM, decreased strength, impaired flexibility,  and pain.   ACTIVITY LIMITATIONS: carrying, lifting, sitting, standing, squatting, sleeping, stairs, dressing, and locomotion level  PARTICIPATION LIMITATIONS: meal prep, cleaning, shopping, and community activity  PERSONAL FACTORS: Fitness, Past/current experiences, and Time since onset of injury/illness/exacerbation are also affecting patient's functional outcome.    GOALS: Goals reviewed with patient? Yes  SHORT TERM GOALS: Target date: 11/05/2023  Patient will be I with initial HEP in order to progress with therapy. Baseline: HEP provided at eval 11/14/2023: independent Goal status: MET  2.  Patient will report right hip pain </= 1/10 in order to reduce functional limitations Baseline: 3/10 pain 11/14/2023: reports 1-2/10 Goal status: ONGOING  LONG TERM GOALS: Target date: 12/03/2023  Patient will be I with final HEP to maintain progress from PT. Baseline: HEP provided at eval Goal status: INITIAL  2.  Patient will report PSFS >/= 7 in order to indicate improvement in their functional ability. Baseline: 4.25 Goal status: INITIAL  3.  Patient will demonstrate right hip strength grossly>/= 4/5 MMT in order to improve walking tolerance and stair negotiation Baseline: see limitations above Goal status: INITIAL  4.  Patient will demonstrate right hip PROM grossly equal to left without pain to improve ability to put on socks/shoes Baseline: see limitations above Goal status: INITIAL   PLAN: PT FREQUENCY: 1-2x/week  PT DURATION: 8 weeks  PLANNED INTERVENTIONS: 97164- PT Re-evaluation, 97750- Physical Performance Testing, 97110-Therapeutic exercises, 97530- Therapeutic activity, 97112- Neuromuscular re-education, 97535- Self Care, [id number]- Manual therapy, [phone]- Gait training, Patient/Family education, Balance training, Stair training, Joint mobilization, Joint manipulation, Spinal manipulation, Spinal  mobilization, Cryotherapy, and Moist heat  PLAN FOR NEXT SESSION: Review HEP and progress PRN, manual/mobs and stretching for right hip, right hip and LE strengthening progressing to closed chain, gait and stair training, balance training   Elaine Daring, PT, DPT, LAT, ATC 11/27/23  4:05 PM Phone: [phone] Fax: [phone]

## 2023-12-04 ENCOUNTER — Ambulatory Visit: Admitting: Physical Therapy

## 2023-12-04 ENCOUNTER — Encounter: Payer: Self-pay | Admitting: Physical Therapy

## 2023-12-04 ENCOUNTER — Other Ambulatory Visit: Payer: Self-pay

## 2023-12-04 DIAGNOSIS — M25551 Pain in right hip: Secondary | ICD-10-CM

## 2023-12-04 DIAGNOSIS — M6281 Muscle weakness (generalized): Secondary | ICD-10-CM

## 2023-12-04 NOTE — Patient Instructions (Signed)
 Access Code: [email or web address] URL: [email or web address] Date: 12/04/2023 Prepared by: Elaine Daring  Exercises - Clam with Resistance  - 1 x daily - 2 sets - 10 reps - Bridge with Resistance  - 1 x daily - 2 sets - 10 reps - Squat with Chair Touch  - 1 x daily - 2 sets - 10 reps - Standing Hip Extension with Counter Support  - 1 x daily - 2 sets - 10 reps

## 2023-12-04 NOTE — Therapy (Unsigned)
 OUTPATIENT PHYSICAL THERAPY TREATMENT   Patient Name: Shelley Berry MRN: [id number] DOB:22-Mar-1955, 68 y.o., female Today's Date: 12/05/2023   END OF SESSION:  PT End of Session - 12/04/23 1612     Visit Number 5    Number of Visits 13    Date for Recertification  01/29/24    Authorization Type [employer]    PT Start Time 1607    PT Stop Time 1645    PT Time Calculation (min) 38 min    Activity Tolerance Patient tolerated treatment well    Behavior During Therapy [facility] for tasks assessed/performed              Past Medical History:  Diagnosis Date   Genital herpes    Ulcerative colitis onset 1998   sees Dr. Ulysees Sous   Past Surgical History:  Procedure Laterality Date   ABDOMINAL HYSTERECTOMY  2000   TAH, ovaries intact    COLONOSCOPY  June 2012   per Dr. Sous, normal, repeat in 2 yrs    PRE-MALIGNANT / BENIGN SKIN LESION EXCISION     several on the back and right thigh    Patient Active Problem List   Diagnosis Date Noted   Ulcerative colitis (HCC) 12/24/2010    PCP: Royden Ronal Czar, FNP  REFERRING PROVIDER: Joane Artist RAMAN, MD  REFERRING DIAG: Right hip pain  THERAPY DIAG:  Pain in right hip  Muscle weakness (generalized)  Rationale for Evaluation and Treatment: Rehabilitation  ONSET DATE: Chronic, ongoing about a year   SUBJECTIVE:  SUBJECTIVE STATEMENT: Patient reports this week her hip hasn't really been bothering her. When it does bother her it is typically at night when she ends up sleeping on the right hip. She feels like her hip and knee feel good some days and she has pain and difficulty with tasks like stairs other days.  Eval: Patient reports she started walking weird maybe a year ago and she also feels like she doesn't have full range of motion on the right side when putting her socks on. She also walks up stairs weird. She works from home and goes up down the stairs and she has to go down stairs one at a time. She also notes pain in the  right hip when she sleeps on that side. When she gets up out of chair she has to stand for a bit before she starts walking because she gets stiff. She has been trying to walk more and that didn't help too much, and it does feel like her knees are being affected by her hip because she is walking weird.   PERTINENT HISTORY: See PMH above  PAIN:  Are you having pain? Yes:  NPRS scale: 2/10 Pain location: Right hip Pain description: Stiff Aggravating factors: Walking, stairs, putting on socks Relieving factors: Stretching  PRECAUTIONS: None  PATIENT GOALS: Improve walking and going up/down stairs   OBJECTIVE:  Note: Objective measures were completed at Evaluation unless otherwise noted. PATIENT SURVEYS:  PSFS: 4.25 Putting on shoes/socks: 4 Bending completely to wash feet/shave: 3 Walking smoothly: 4 Going up and down stairs: 6  12/04/2023:  PSFS: 5.75 Putting on shoes/socks: 6 Bending completely to wash feet/shave: 6 Walking smoothly: 5 Going up and down stairs: 6  MUSCLE LENGTH: Slight limitation with bilateral hamstring, right hip flexor and quad and piriformis flexibility  POSTURE:   Grossly WFL  PALPATION: Mildly tender to lateral aspect of right hip  Trial LAD on right and patient reports pulling  in right lower back region  LOWER EXTREMITY ROM: Right hip PROM IR and extension slightly limited compared to left 12/04/2023: slight limitation with hip IR, mild groin and lateral hip discomfort at end range flexion and IR  LOWER EXTREMITY MMT:  MMT Right eval Left eval Rt / Lt 12/04/2023  Hip flexion 4- 4   Hip extension 3 4- 4-  Hip abduction 4- 4 4-  Hip adduction     Hip internal rotation     Hip external rotation     Knee flexion 4+ 5   Knee extension 5 5   Ankle dorsiflexion     Ankle plantarflexion     Ankle inversion     Ankle eversion      (Blank rows = not tested)  FUNCTIONAL TESTS:  5 times sit to stand: 11 seconds  GAIT: Assistive device  utilized: None Level of assistance: Complete Independence Comments: Antalgic on right, reduced hip extension on right                                                                                                                               TREATMENT [facility] Adult PT Treatment:                                                DATE: 12/04/2023 Recumbent bike L4 x 5 min to improve endurance and workload capacity LAD with belt x 3 bouts on right Posterior hip mobs with hip flexed to 90 deg x 3 bouts on right Lateral hip mobs with belt x 3 bouts Bridge with black band at knees 2 x 10 x 5 sec Squat to table tap 2 x 10 Standing hip extension with green at knees 2 x 10  PATIENT EDUCATION:  Education details: HEP update Person educated: Patient Education method: Explanation, Demonstration, Tactile cues, Verbal cues, Handout Education comprehension: verbalized understanding, returned demonstration, verbal cues required, tactile cues required, and needs further education  HOME EXERCISE PROGRAM: Access Code: [email or web address]    ASSESSMENT: CLINICAL IMPRESSION: Patient tolerated therapy well with no adverse effects. She does demonstrate improvement in her hip strength and reports improvement in functional status on the PSFS, but does continue to have limitations with right hip mobility and strength that seems to be impacting her mobility and leading to some ITB symptoms at the knee. Therapy focused on manual to improve her right hip mobility and progressing her hip strengthening. Updated her HEP to further progress hip strengthening with good tolerance. Patient would benefit from continued skilled PT to progress her mobility and strength in order to reduce pain and maximize functional ability.   Eval: Patient is a 68 y.o. female who was seen today for physical therapy evaluation and treatment for chronic right hip pain. Currently she exhibits slight limitations in hip motion, gross flexibility  deficits of the  right hip, gross strength deficits of the right hip, gait deviations that are likely contributing to her pain and impacting her functional ability.   OBJECTIVE IMPAIRMENTS: Abnormal gait, decreased activity tolerance, decreased balance, decreased ROM, decreased strength, impaired flexibility, and pain.   ACTIVITY LIMITATIONS: carrying, lifting, sitting, standing, squatting, sleeping, stairs, dressing, and locomotion level  PARTICIPATION LIMITATIONS: meal prep, cleaning, shopping, and community activity  PERSONAL FACTORS: Fitness, Past/current experiences, and Time since onset of injury/illness/exacerbation are also affecting patient's functional outcome.    GOALS: Goals reviewed with patient? Yes  SHORT TERM GOALS: Target date: 11/05/2023  Patient will be I with initial HEP in order to progress with therapy. Baseline: HEP provided at eval 11/14/2023: independent Goal status: MET  2.  Patient will report right hip pain </= 1/10 in order to reduce functional limitations Baseline: 3/10 pain 11/14/2023: reports 1-2/10 Goal status: ONGOING  LONG TERM GOALS: Target date: 01/29/2024  Patient will be I with final HEP to maintain progress from PT. Baseline: HEP provided at eval 12/04/2023: progressing Goal status: ONGOING  2.  Patient will report PSFS >/= 7 in order to indicate improvement in their functional ability. Baseline: 4.25 12/04/2023: 5.75 Goal status: ONGOING  3.  Patient will demonstrate right hip strength grossly>/= 4/5 MMT in order to improve walking tolerance and stair negotiation Baseline: see limitations above 12/04/2023: see limitations above Goal status: ONGOING  4.  Patient will demonstrate right hip PROM grossly equal to left without pain to improve ability to put on socks/shoes Baseline: see limitations above 12/04/2023: continues to report some groin discomfort with hip PROM Goal status: ONGOING   PLAN: PT FREQUENCY: 1x/week  PT DURATION: 8 weeks  PLANNED  INTERVENTIONS: 97164- PT Re-evaluation, 97750- Physical Performance Testing, 97110-Therapeutic exercises, 97530- Therapeutic activity, 97112- Neuromuscular re-education, 97535- Self Care, 02859- Manual therapy, [phone]- Gait training, Patient/Family education, Balance training, Stair training, Joint mobilization, Joint manipulation, Spinal manipulation, Spinal mobilization, Cryotherapy, and Moist heat  PLAN FOR NEXT SESSION: Review HEP and progress PRN, manual/mobs and stretching for right hip, right hip and LE strengthening progressing to closed chain, gait and stair training, balance training   Elaine Daring, PT, DPT, LAT, ATC 12/05/23  8:02 AM Phone: [phone] Fax: [phone]

## 2023-12-11 ENCOUNTER — Other Ambulatory Visit: Payer: Self-pay

## 2023-12-11 ENCOUNTER — Encounter: Payer: Self-pay | Admitting: Physical Therapy

## 2023-12-11 ENCOUNTER — Ambulatory Visit: Admitting: Physical Therapy

## 2023-12-11 DIAGNOSIS — M6281 Muscle weakness (generalized): Secondary | ICD-10-CM | POA: Diagnosis not present

## 2023-12-11 DIAGNOSIS — M25551 Pain in right hip: Secondary | ICD-10-CM | POA: Diagnosis not present

## 2023-12-11 NOTE — Therapy (Signed)
 OUTPATIENT PHYSICAL THERAPY TREATMENT   Patient Name: Shelley Berry MRN: [id number] DOB:[birth year], 68 y.o., female Today's Date: 12/11/2023   END OF SESSION:  PT End of Session - 12/11/23 1528     Visit Number 6    Number of Visits 13    Date for Recertification  01/29/24    Authorization Type [employer]    PT Start Time 1523    PT Stop Time 1601    PT Time Calculation (min) 38 min    Activity Tolerance Patient tolerated treatment well    Behavior During Therapy [facility] for tasks assessed/performed               Past Medical History:  Diagnosis Date   Genital herpes    Ulcerative colitis onset 1998   sees Dr. Ulysees Sous   Past Surgical History:  Procedure Laterality Date   ABDOMINAL HYSTERECTOMY  2000   TAH, ovaries intact    COLONOSCOPY  June 2012   per Dr. Sous, normal, repeat in 2 yrs    PRE-MALIGNANT / BENIGN SKIN LESION EXCISION     several on the back and right thigh    Patient Active Problem List   Diagnosis Date Noted   Ulcerative colitis (HCC) 12/24/2010    PCP: Royden Ronal Czar, FNP  REFERRING PROVIDER: Joane Artist RAMAN, MD  REFERRING DIAG: Right hip pain  THERAPY DIAG:  Pain in right hip  Muscle weakness (generalized)  Rationale for Evaluation and Treatment: Rehabilitation  ONSET DATE: Chronic, ongoing about a year   SUBJECTIVE:  SUBJECTIVE STATEMENT: Patient reports her right knee has been bothering her more this week. She was standing on an uneven surface for a while which seemed to tweak her knee.   Eval: Patient reports she started walking weird maybe a year ago and she also feels like she doesn't have full range of motion on the right side when putting her socks on. She also walks up stairs weird. She works from home and goes up down the stairs and she has to go down stairs one at a time. She also notes pain in the right hip when she sleeps on that side. When she gets up out of chair she has to stand for a bit before she starts walking  because she gets stiff. She has been trying to walk more and that didn't help too much, and it does feel like her knees are being affected by her hip because she is walking weird.   PERTINENT HISTORY: See PMH above  PAIN:  Are you having pain? Yes:  NPRS scale: 2/10 Pain location: Right hip Pain description: Stiff Aggravating factors: Walking, stairs, putting on socks Relieving factors: Stretching  PRECAUTIONS: None  PATIENT GOALS: Improve walking and going up/down stairs   OBJECTIVE:  Note: Objective measures were completed at Evaluation unless otherwise noted. PATIENT SURVEYS:  PSFS: 4.25 Putting on shoes/socks: 4 Bending completely to wash feet/shave: 3 Walking smoothly: 4 Going up and down stairs: 6  12/04/2023:  PSFS: 5.75 Putting on shoes/socks: 6 Bending completely to wash feet/shave: 6 Walking smoothly: 5 Going up and down stairs: 6  MUSCLE LENGTH: Slight limitation with bilateral hamstring, right hip flexor and quad and piriformis flexibility  POSTURE:   Grossly WFL  PALPATION: Mildly tender to lateral aspect of right hip  Trial LAD on right and patient reports pulling in right lower back region  LOWER EXTREMITY ROM: Right hip PROM IR and extension slightly limited compared to left 12/04/2023: slight limitation  with hip IR, mild groin and lateral hip discomfort at end range flexion and IR  LOWER EXTREMITY MMT:  MMT Right eval Left eval Rt / Lt 12/04/2023  Hip flexion 4- 4   Hip extension 3 4- 4-  Hip abduction 4- 4 4-  Hip adduction     Hip internal rotation     Hip external rotation     Knee flexion 4+ 5   Knee extension 5 5   Ankle dorsiflexion     Ankle plantarflexion     Ankle inversion     Ankle eversion      (Blank rows = not tested)  FUNCTIONAL TESTS:  5 times sit to stand: 11 seconds  GAIT: Assistive device utilized: None Level of assistance: Complete Independence Comments: Antalgic on right, reduced hip extension on right                                                                                                                                TREATMENT [facility] Adult PT Treatment:                                                DATE: 12/11/2023 Supine passive hamstring stretch 2 x 30 sec Supine passive ITB stretch 3 x 30 sec Bridge with black band at knees 2 x 10 x 5 sec Sidelying hip abduction 3 x 10 LAQ with 5# 3 x 10 Squat to table tap holding 15# at chest 3 x 10 Standing hip extension and abduction with green at knees 2 x 10  PATIENT EDUCATION:  Education details: HEP update Person educated: Patient Education method: Explanation, Demonstration, Tactile cues, Verbal cues, Handout Education comprehension: verbalized understanding, returned demonstration, verbal cues required, tactile cues required, and needs further education  HOME EXERCISE PROGRAM: Access Code: [email or web address]    ASSESSMENT: CLINICAL IMPRESSION: Patient tolerated therapy well with no adverse effects. Today patient reports more lateral knee pain that still seems consistent with ITB related pain. Therapy focused primarily on strengthening for the right hip and knee with good tolerance, and incorporated more hip abduction strengthening this visit. No changes made to her HEP. Patient would benefit from continued skilled PT to progress her mobility and strength in order to reduce pain and maximize functional ability.   Eval: Patient is a 68 y.o. female who was seen today for physical therapy evaluation and treatment for chronic right hip pain. Currently she exhibits slight limitations in hip motion, gross flexibility deficits of the right hip, gross strength deficits of the right hip, gait deviations that are likely contributing to her pain and impacting her functional ability.   OBJECTIVE IMPAIRMENTS: Abnormal gait, decreased activity tolerance, decreased balance, decreased ROM, decreased strength, impaired flexibility, and pain.   ACTIVITY  LIMITATIONS: carrying, lifting, sitting, standing, squatting, sleeping, stairs, dressing, and  locomotion level  PARTICIPATION LIMITATIONS: meal prep, cleaning, shopping, and community activity  PERSONAL FACTORS: Fitness, Past/current experiences, and Time since onset of injury/illness/exacerbation are also affecting patient's functional outcome.    GOALS: Goals reviewed with patient? Yes  SHORT TERM GOALS: Target date: 11/05/2023  Patient will be I with initial HEP in order to progress with therapy. Baseline: HEP provided at eval 11/14/2023: independent Goal status: MET  2.  Patient will report right hip pain </= 1/10 in order to reduce functional limitations Baseline: 3/10 pain 11/14/2023: reports 1-2/10 Goal status: ONGOING  LONG TERM GOALS: Target date: 01/29/2024  Patient will be I with final HEP to maintain progress from PT. Baseline: HEP provided at eval 12/04/2023: progressing Goal status: ONGOING  2.  Patient will report PSFS >/= 7 in order to indicate improvement in their functional ability. Baseline: 4.25 12/04/2023: 5.75 Goal status: ONGOING  3.  Patient will demonstrate right hip strength grossly>/= 4/5 MMT in order to improve walking tolerance and stair negotiation Baseline: see limitations above 12/04/2023: see limitations above Goal status: ONGOING  4.  Patient will demonstrate right hip PROM grossly equal to left without pain to improve ability to put on socks/shoes Baseline: see limitations above 12/04/2023: continues to report some groin discomfort with hip PROM Goal status: ONGOING   PLAN: PT FREQUENCY: 1x/week  PT DURATION: 8 weeks  PLANNED INTERVENTIONS: 97164- PT Re-evaluation, 97750- Physical Performance Testing, 97110-Therapeutic exercises, 97530- Therapeutic activity, 97112- Neuromuscular re-education, 97535- Self Care, [id number]- Manual therapy, [phone]- Gait training, Patient/Family education, Balance training, Stair training, Joint mobilization, Joint  manipulation, Spinal manipulation, Spinal mobilization, Cryotherapy, and Moist heat  PLAN FOR NEXT SESSION: Review HEP and progress PRN, manual/mobs and stretching for right hip, right hip and LE strengthening progressing to closed chain, gait and stair training, balance training   Elaine Daring, PT, DPT, LAT, ATC 12/11/23  4:01 PM Phone: [phone] Fax: [phone]

## 2023-12-24 ENCOUNTER — Encounter: Admitting: Physical Therapy

## 2024-01-02 ENCOUNTER — Encounter: Admitting: Physical Therapy

## 2024-01-07 ENCOUNTER — Encounter: Admitting: Physical Therapy

## 2024-01-15 ENCOUNTER — Other Ambulatory Visit: Payer: Self-pay

## 2024-01-15 ENCOUNTER — Encounter: Payer: Self-pay | Admitting: Physical Therapy

## 2024-01-15 ENCOUNTER — Ambulatory Visit: Admitting: Physical Therapy

## 2024-01-15 DIAGNOSIS — M25551 Pain in right hip: Secondary | ICD-10-CM | POA: Diagnosis not present

## 2024-01-15 DIAGNOSIS — M6281 Muscle weakness (generalized): Secondary | ICD-10-CM

## 2024-01-15 NOTE — Therapy (Addendum)
 " OUTPATIENT PHYSICAL THERAPY TREATMENT  DISCHARGE   Patient Name: Shelley Berry MRN: [id number] DOB:May 25, 1955, 68 y.o., female Today's Date: 01/15/2024   END OF SESSION:  PT End of Session - 01/15/24 1509     Visit Number 7    Number of Visits 13    Date for Recertification  01/29/24    Authorization Type [employer]    PT Start Time 1432    PT Stop Time 1515    PT Time Calculation (min) 43 min    Activity Tolerance Patient tolerated treatment well    Behavior During Therapy [facility] for tasks assessed/performed                Past Medical History:  Diagnosis Date   Genital herpes    Ulcerative colitis onset 1998   sees Dr. Ulysees Sous   Past Surgical History:  Procedure Laterality Date   ABDOMINAL HYSTERECTOMY  2000   TAH, ovaries intact    COLONOSCOPY  June 2012   per Dr. Sous, normal, repeat in 2 yrs    PRE-MALIGNANT / BENIGN SKIN LESION EXCISION     several on the back and right thigh    Patient Active Problem List   Diagnosis Date Noted   Ulcerative colitis (HCC) 12/24/2010    PCP: Royden Ronal Czar, FNP  REFERRING PROVIDER: Joane Artist RAMAN, MD  REFERRING DIAG: Right hip pain  THERAPY DIAG:  Pain in right hip  Muscle weakness (generalized)  Rationale for Evaluation and Treatment: Rehabilitation  ONSET DATE: Chronic, ongoing about a year   SUBJECTIVE:  SUBJECTIVE STATEMENT: Patient reports she is feeling pretty good the past couple of weeks, she feels like she is walking more normal. She did have to miss her last visits because she broke her toe and had a lot of pain.  Eval: Patient reports she started walking weird maybe a year ago and she also feels like she doesn't have full range of motion on the right side when putting her socks on. She also walks up stairs weird. She works from home and goes up down the stairs and she has to go down stairs one at a time. She also notes pain in the right hip when she sleeps on that side. When she gets up out of  chair she has to stand for a bit before she starts walking because she gets stiff. She has been trying to walk more and that didn't help too much, and it does feel like her knees are being affected by her hip because she is walking weird.   PERTINENT HISTORY: See PMH above  PAIN:  Are you having pain? Yes:  NPRS scale: 1/10 Pain location: Right hip Pain description: Stiff Aggravating factors: Walking, stairs, putting on socks Relieving factors: Stretching  PRECAUTIONS: None  PATIENT GOALS: Improve walking and going up/down stairs   OBJECTIVE:  Note: Objective measures were completed at Evaluation unless otherwise noted. PATIENT SURVEYS:  PSFS: 4.25 Putting on shoes/socks: 4 Bending completely to wash feet/shave: 3 Walking smoothly: 4 Going up and down stairs: 6  12/04/2023:  PSFS: 5.75 Putting on shoes/socks: 6 Bending completely to wash feet/shave: 6 Walking smoothly: 5 Going up and down stairs: 6  MUSCLE LENGTH: Slight limitation with bilateral hamstring, right hip flexor and quad and piriformis flexibility  POSTURE:   Grossly WFL  PALPATION: Mildly tender to lateral aspect of right hip  Trial LAD on right and patient reports pulling in right lower back region  LOWER EXTREMITY ROM:  Right hip PROM IR and extension slightly limited compared to left 12/04/2023: slight limitation with hip IR, mild groin and lateral hip discomfort at end range flexion and IR  LOWER EXTREMITY MMT:  MMT Right eval Left eval Rt / Lt 12/04/2023 Right 01/15/2024  Hip flexion 4- 4    Hip extension 3 4- 4- 4-  Hip abduction 4- 4 4- 4-  Hip adduction      Hip internal rotation      Hip external rotation      Knee flexion 4+ 5    Knee extension 5 5    Ankle dorsiflexion      Ankle plantarflexion      Ankle inversion      Ankle eversion       (Blank rows = not tested)  FUNCTIONAL TESTS:  5 times sit to stand: 11 seconds  GAIT: Assistive device utilized: None Level of  assistance: Complete Independence Comments: Antalgic on right, reduced hip extension on right                                                                                                                               TREATMENT [facility] Adult PT Treatment:                                                DATE: 01/15/2024 Recumbent L3 x 5 min to improve endurance and workload capacity PROM right hip all directions Supine passive hamstring stretch 3 x 30 sec Supine passive ITB stretch 3 x 30 sec Modified thomas stretch 3 x 30 sec Bridge with black band at knees 2 x 10 x 5 sec SL bridge x 10 each Goblet squat to table tap holding 15# at chest 3 x 10 Forward 8 step-up 2 x 10 each  PATIENT EDUCATION:  Education details: HEP Person educated: Patient Education method: Programmer, Multimedia, Demonstration, Actor cues, Verbal cues Education comprehension: verbalized understanding, returned demonstration, verbal cues required, tactile cues required, and needs further education  HOME EXERCISE PROGRAM: Access Code: [email or web address]    ASSESSMENT: CLINICAL IMPRESSION: Patient tolerated therapy well with no adverse effects. Therapy focused on improving her flexibility and progressing hip and LE strengthening with good tolerance. She does continue to exhibit strength deficit of the right hip but was able to tolerate progressions in strengthening well. No changes made to her HEP. Patient would benefit from continued skilled PT to progress her mobility and strength in order to reduce pain and maximize functional ability.   Eval: Patient is a 68 y.o. female who was seen today for physical therapy evaluation and treatment for chronic right hip pain. Currently she exhibits slight limitations in hip motion, gross flexibility deficits of the right hip, gross strength deficits of the right hip, gait deviations that are likely contributing to her pain and impacting  her functional ability.   OBJECTIVE IMPAIRMENTS: Abnormal  gait, decreased activity tolerance, decreased balance, decreased ROM, decreased strength, impaired flexibility, and pain.   ACTIVITY LIMITATIONS: carrying, lifting, sitting, standing, squatting, sleeping, stairs, dressing, and locomotion level  PARTICIPATION LIMITATIONS: meal prep, cleaning, shopping, and community activity  PERSONAL FACTORS: Fitness, Past/current experiences, and Time since onset of injury/illness/exacerbation are also affecting patient's functional outcome.    GOALS: Goals reviewed with patient? Yes  SHORT TERM GOALS: Target date: 11/05/2023  Patient will be I with initial HEP in order to progress with therapy. Baseline: HEP provided at eval 11/14/2023: independent Goal status: MET  2.  Patient will report right hip pain </= 1/10 in order to reduce functional limitations Baseline: 3/10 pain 11/14/2023: reports 1-2/10 Goal status: ONGOING  LONG TERM GOALS: Target date: 01/29/2024  Patient will be I with final HEP to maintain progress from PT. Baseline: HEP provided at eval 12/04/2023: progressing Goal status: ONGOING  2.  Patient will report PSFS >/= 7 in order to indicate improvement in their functional ability. Baseline: 4.25 12/04/2023: 5.75 Goal status: ONGOING  3.  Patient will demonstrate right hip strength grossly>/= 4/5 MMT in order to improve walking tolerance and stair negotiation Baseline: see limitations above 12/04/2023: see limitations above Goal status: ONGOING  4.  Patient will demonstrate right hip PROM grossly equal to left without pain to improve ability to put on socks/shoes Baseline: see limitations above 12/04/2023: continues to report some groin discomfort with hip PROM Goal status: ONGOING   PLAN: PT FREQUENCY: 1x/week  PT DURATION: 8 weeks  PLANNED INTERVENTIONS: 97164- PT Re-evaluation, 97750- Physical Performance Testing, 97110-Therapeutic exercises, 97530- Therapeutic activity, 97112- Neuromuscular re-education, 97535- Self  Care, 02859- Manual therapy, [phone]- Gait training, Patient/Family education, Balance training, Stair training, Joint mobilization, Joint manipulation, Spinal manipulation, Spinal mobilization, Cryotherapy, and Moist heat  PLAN FOR NEXT SESSION: Review HEP and progress PRN, manual/mobs and stretching for right hip, right hip and LE strengthening progressing to closed chain, gait and stair training, balance training   Elaine Daring, PT, DPT, LAT, ATC 01/15/24  3:18 PM Phone: [phone] Fax: [phone]  PHYSICAL THERAPY DISCHARGE SUMMARY  Visits from Start of Care: 7  Current functional level related to goals / functional outcomes: See above   Remaining deficits: See above   Education / Equipment: HEP   Patient agrees to discharge. Patient goals were partially met. Patient is being discharged due to not returning since the last visit.  Elaine Daring, PT, DPT, LAT, ATC 03/17/2024  2:13 PM Phone: [phone] Fax: [phone]   "

## 2024-01-22 ENCOUNTER — Encounter: Admitting: Physical Therapy

## 2024-02-03 ENCOUNTER — Encounter: Admitting: Physical Therapy
